# Patient Record
Sex: Female | Born: 1935 | Race: White | Hispanic: No | Marital: Married | Smoking: Never smoker
Health system: Southern US, Community
[De-identification: ages and names within clinical notes are randomized; demographics above are authoritative.]

## PROBLEM LIST (undated history)

## (undated) DIAGNOSIS — K219 Gastro-esophageal reflux disease without esophagitis: Secondary | ICD-10-CM

## (undated) DIAGNOSIS — Z8719 Personal history of other diseases of the digestive system: Secondary | ICD-10-CM

## (undated) DIAGNOSIS — E7800 Pure hypercholesterolemia, unspecified: Secondary | ICD-10-CM

## (undated) DIAGNOSIS — I10 Essential (primary) hypertension: Secondary | ICD-10-CM

## (undated) DIAGNOSIS — F419 Anxiety disorder, unspecified: Secondary | ICD-10-CM

## (undated) DIAGNOSIS — M419 Scoliosis, unspecified: Secondary | ICD-10-CM

## (undated) HISTORY — PX: TUBAL LIGATION: SHX77

## (undated) HISTORY — PX: JOINT REPLACEMENT: SHX530

## (undated) HISTORY — PX: BACK SURGERY: SHX140

## (undated) HISTORY — PX: CATARACT EXTRACTION: SUR2

## (undated) HISTORY — PX: APPENDECTOMY: SHX54

---

## 1973-02-23 HISTORY — PX: VAGINAL HYSTERECTOMY: SUR661

## 2004-08-28 ENCOUNTER — Encounter: Admission: RE | Admit: 2004-08-28 | Discharge: 2004-08-28 | Payer: Self-pay | Admitting: Orthopaedic Surgery

## 2004-10-13 ENCOUNTER — Encounter: Admission: RE | Admit: 2004-10-13 | Discharge: 2004-10-13 | Payer: Self-pay | Admitting: Orthopaedic Surgery

## 2010-02-23 HISTORY — PX: JOINT REPLACEMENT: SHX530

## 2012-02-24 HISTORY — PX: OTHER SURGICAL HISTORY: SHX169

## 2017-11-16 NOTE — H&P (Signed)
TOTAL HIP REVISION ADMISSION H&P  Patient is admitted for left total hip acetabular revision  Subjective:  Chief Complaint: left hip pain  HPI:The patient is an 83 y/o female who presents for pre-operative visit in preparation for their left total hip acetabular revision, which is scheduled on 12/08/17 with Dr. Lequita HaltAluisio at [facility]. The patient has had symptoms in the left hip including recurrent dislocation and posterior hip pain that will occasionally radiate down to her knee. This has impacted her quality of life adn ability to do activities of daily living. 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 She has a history of left total hip in 2009, and then a revision in 2016 after multiple dislocations. Both procedures were done by Dr. Wyline MoodWeller. She reports that the locking ring has broken two times since we last saw her. The patient denies an active infection.  There are no active problems to display for this patient.  History reviewed. No pertinent past medical history.  History reviewed. No pertinent surgical history.  No current facility-administered medications for this encounter.    No current outpatient medications on file.   Allergies not on file  Social History   Tobacco Use  . Smoking status: Not on file  Substance Use Topics  . Alcohol use: Not on file    History reviewed. No pertinent family history.    Review of Systems  Constitutional: Negative for chills and fever.  HENT: Negative for congestion, sore throat and tinnitus.   Eyes: Negative for double vision, photophobia and pain.  Respiratory: Negative for cough, shortness of breath and wheezing.   Cardiovascular: Negative for chest pain, palpitations and orthopnea.  Gastrointestinal: Negative for heartburn, nausea and vomiting.  Genitourinary: Negative for dysuria, frequency and urgency.  Musculoskeletal: Positive for joint pain.  Neurological: Negative for dizziness,  weakness and headaches.  Psychiatric/Behavioral: Negative for depression.    Objective:  Physical Exam  Well nourished and well developed. General: Alert and oriented x3, cooperative and pleasant, no acute distress. Head: normocephalic, atraumatic, neck supple. Eyes: EOMI. Respiratory: breath sounds clear in all fields, no wheezing, rales, or rhonchi. Cardiovascular: Regular rate and rhythm, no murmurs, gallops or rubs.  Abdomen: non-tender to palpation and soft, normoactive bowel sounds. Musculoskeletal: Antalgic gait and walking without assisted devices. Left hip exam: No swelling. Range of motion: Flexion 110 degrees, Internal rotation 20 degrees, External rotation 30 degrees, Abduction 30 degrees without pain. She is nontender over the greater trochanter. She has an antalgic gait pattern.  The patient has significant scoliosis in her thoracolumbar spine.  Calves soft and nontender. Motor function intact in LE. Strength 5/5 LE bilaterally. Neuro: Distal pulses 2+. Sensation to light touch intact in LE.  Vital signs in last 24 hours: Blood pressure: 152/78 mmHg  Labs:   There is no height or weight on file to calculate BMI.  Imaging Review:  There is no evidence of loosening of the acetabular cup or femoral stem.The bone quality appears to be adequate for age and reported activity level.    Preoperative templating of the joint replacement has been completed, documented, and submitted to the Operating Room personnel in order to optimize intra-operative equipment management.   Assessment/Plan:  End stage arthritis, left hip(s) with failed previous arthroplasty.  The patient history, physical examination, clinical judgement of the provider and imaging studies are consistent with end stage degenerative joint disease of the left hip(s), previous total hip arthroplasty. Revision total hip arthroplasty is deemed  medically necessary. The treatment options including medical  management, injection therapy, arthroscopy and arthroplasty were discussed at length. The risks and benefits of total hip arthroplasty were presented and reviewed. The risks due to aseptic loosening, infection, stiffness, dislocation/subluxation,  thromboembolic complications and other imponderables were discussed.  The patient acknowledged the explanation, agreed to proceed with the plan and consent was signed. Patient is being admitted for inpatient treatment for surgery, pain control, PT, OT, prophylactic antibiotics, VTE prophylaxis, progressive ambulation and ADL's and discharge planning. The patient is planning to be discharged home with HHPT versus SNF.   Therapy Plans: SNF versus HHPT then outpatient therapy Disposition: SNF versus home with husband Planned DVT Prophylaxis: Aspirin 325 mg BID DME needed: None PCP: Margaree Mackintosh (medical clearance provided) TXA: IV Allergies: Codeine (hives), oxycodone (hives) Planned Anesthesia: General Other: Pt reports she does not have a reaction with hydrocodone.  Hx of severe scoliosis.   - Patient was instructed on what medications to stop prior to surgery. - Follow-up visit in 2 weeks with Dr. Lequita Halt - Begin physical therapy following surgery - Pre-operative lab work as pre-surgical testing - Prescriptions will be provided in hospital at time of discharge  Arther Abbott, PA-C Orthopedic Surgery [facility] [place]

## 2017-11-18 ENCOUNTER — Encounter: Payer: Self-pay | Admitting: Student

## 2017-11-29 NOTE — Patient Instructions (Addendum)
Abigail Wolf  11/29/2017   Your procedure is scheduled on: 12-08-17   Report to [name] Main  Entrance    Report to admitting at 6;00AM    Call this number if you have problems the morning of surgery [phone]     Remember: Do not eat food or drink liquids :After Midnight. BRUSH YOUR TEETH MORNING OF SURGERY AND RINSE YOUR MOUTH OUT, NO CHEWING GUM CANDY OR MINTS.     Take these medicines the morning of surgery with A SIP OF WATER: clonazepam if needed                                You may not have any metal on your body including hair pins and              piercings  Do not wear jewelry, make-up, lotions, powders or perfumes, deodorant             Do not wear nail polish.  Do not shave  48 hours prior to surgery.                 Do not bring valuables to the hospital. [name] IS NOT             RESPONSIBLE   FOR VALUABLES.  Contacts, dentures or bridgework may not be worn into surgery.  Leave suitcase in the car. After surgery it may be brought to your room.                Please read over the following fact sheets you were given: _____________________________________________________________________             [name] - Preparing for Surgery Before surgery, you can play an important role.  Because skin is not sterile, your skin needs to be as free of germs as possible.  You can reduce the number of germs on your skin by washing with CHG (chlorahexidine gluconate) soap before surgery.  CHG is an antiseptic cleaner which kills germs and bonds with the skin to continue killing germs even after washing. Please DO NOT use if you have an allergy to CHG or antibacterial soaps.  If your skin becomes reddened/irritated stop using the CHG and inform your nurse when you arrive at Short Stay. Do not shave (including legs and underarms) for at least 48 hours prior to the first CHG shower.  You may shave your face/neck. Please follow these  instructions carefully:  1.  Shower with CHG Soap the night before surgery and the  morning of Surgery.  2.  If you choose to wash your hair, wash your hair first as usual with your  normal  shampoo.  3.  After you shampoo, rinse your hair and body thoroughly to remove the  shampoo.                           4.  Use CHG as you would any other liquid soap.  You can apply chg directly  to the skin and wash                       Gently with a scrungie or clean washcloth.  5.  Apply the CHG Soap to your body ONLY FROM THE NECK DOWN.  Do not use on face/ open                           Wound or open sores. Avoid contact with eyes, ears mouth and genitals (private parts).                       Wash face,  Genitals (private parts) with your normal soap.             6.  Wash thoroughly, paying special attention to the area where your surgery  will be performed.  7.  Thoroughly rinse your body with warm water from the neck down.  8.  DO NOT shower/wash with your normal soap after using and rinsing off  the CHG Soap.                9.  Pat yourself dry with a clean towel.            10.  Wear clean pajamas.            11.  Place clean sheets on your bed the night of your first shower and do not  sleep with pets. Day of Surgery : Do not apply any lotions/deodorants the morning of surgery.  Please wear clean clothes to the hospital/surgery center.  FAILURE TO FOLLOW THESE INSTRUCTIONS MAY RESULT IN THE CANCELLATION OF YOUR SURGERY PATIENT SIGNATURE_________________________________  NURSE SIGNATURE__________________________________  ________________________________________________________________________   Abigail Wolf  An incentive spirometer is a tool that can help keep your lungs clear and active. This tool measures how well you are filling your lungs with each breath. Taking long deep breaths may help reverse or decrease the chance of developing breathing (pulmonary) problems (especially  infection) following:  A long period of time when you are unable to move or be active. BEFORE THE PROCEDURE   If the spirometer includes an indicator to show your best effort, your nurse or respiratory therapist will set it to a desired goal.  If possible, sit up straight or lean slightly forward. Try not to slouch.  Hold the incentive spirometer in an upright position. INSTRUCTIONS FOR USE  1. Sit on the edge of your bed if possible, or sit up as far as you can in bed or on a chair. 2. Hold the incentive spirometer in an upright position. 3. Breathe out normally. 4. Place the mouthpiece in your mouth and seal your lips tightly around it. 5. Breathe in slowly and as deeply as possible, raising the piston or the ball toward the top of the column. 6. Hold your breath for 3-5 seconds or for as long as possible. Allow the piston or ball to fall to the bottom of the column. 7. Remove the mouthpiece from your mouth and breathe out normally. 8. Rest for a few seconds and repeat Steps 1 through 7 at least 10 times every 1-2 hours when you are awake. Take your time and take a few normal breaths between deep breaths. 9. The spirometer may include an indicator to show your best effort. Use the indicator as a goal to work toward during each repetition. 10. After each set of 10 deep breaths, practice coughing to be sure your lungs are clear. If you have an incision (the cut made at the time of surgery), support your incision when coughing by placing a pillow or rolled up towels firmly against it. Once you are able to get out of  bed, walk around indoors and cough well. You may stop using the incentive spirometer when instructed by your caregiver.  RISKS AND COMPLICATIONS  Take your time so you do not get dizzy or light-headed.  If you are in pain, you may need to take or ask for pain medication before doing incentive spirometry. It is harder to take a deep breath if you are having pain. AFTER  USE  Rest and breathe slowly and easily.  It can be helpful to keep track of a log of your progress. Your caregiver can provide you with a simple table to help with this. If you are using the spirometer at home, follow these instructions: [place] IF:   You are having difficultly using the spirometer.  You have trouble using the spirometer as often as instructed.  Your pain medication is not giving enough relief while using the spirometer.  You develop fever of 100.5 F (38.1 C) or higher. SEEK IMMEDIATE MEDICAL CARE IF:   You cough up bloody sputum that had not been present before.  You develop fever of 102 F (38.9 C) or greater.  You develop worsening pain at or near the incision site. MAKE SURE YOU:   Understand these instructions.  Will watch your condition.  Will get help right away if you are not doing well or get worse. Document Released: 06/22/2006 Document Revised: 05/04/2011 Document Reviewed: 08/23/2006 Exit[employer] Patient Information 2014 Exit[employer], [place].   ________________________________________________________________________  WHAT IS A BLOOD TRANSFUSION? Blood Transfusion Information  A transfusion is the replacement of blood or some of its parts. Blood is made up of multiple cells which provide different functions.  Red blood cells carry oxygen and are used for blood loss replacement.  White blood cells fight against infection.  Platelets control bleeding.  Plasma helps clot blood.  Other blood products are available for specialized needs, such as hemophilia or other clotting disorders. BEFORE THE TRANSFUSION  Who gives blood for transfusions?   Healthy volunteers who are fully evaluated to make sure their blood is safe. This is blood bank blood. Transfusion therapy is the safest it has ever been in the practice of medicine. Before blood is taken from a donor, a complete history is taken to make sure that person has no history of diseases  nor engages in risky social behavior (examples are intravenous drug use or sexual activity with multiple partners). The donor's travel history is screened to minimize risk of transmitting infections, such as malaria. The donated blood is tested for signs of infectious diseases, such as HIV and hepatitis. The blood is then tested to be sure it is compatible with you in order to minimize the chance of a transfusion reaction. If you or a relative donates blood, this is often done in anticipation of surgery and is not appropriate for emergency situations. It takes many days to process the donated blood. RISKS AND COMPLICATIONS Although transfusion therapy is very safe and saves many lives, the main dangers of transfusion include:   Getting an infectious disease.  Developing a transfusion reaction. This is an allergic reaction to something in the blood you were given. Every precaution is taken to prevent this. The decision to have a blood transfusion has been considered carefully by your caregiver before blood is given. Blood is not given unless the benefits outweigh the risks. AFTER THE TRANSFUSION  Right after receiving a blood transfusion, you will usually feel much better and more energetic. This is especially true if your red blood  cells have gotten low (anemic). The transfusion raises the level of the red blood cells which carry oxygen, and this usually causes an energy increase.  The nurse administering the transfusion will monitor you carefully for complications. HOME CARE INSTRUCTIONS  No special instructions are needed after a transfusion. You may find your energy is better. Speak with your caregiver about any limitations on activity for underlying diseases you may have. SEEK MEDICAL CARE IF:   Your condition is not improving after your transfusion.  You develop redness or irritation at the intravenous (IV) site. SEEK IMMEDIATE MEDICAL CARE IF:  Any of the following symptoms occur over the  next 12 hours:  Shaking chills.  You have a temperature by mouth above 102 F (38.9 C), not controlled by medicine.  Chest, back, or muscle pain.  People around you feel you are not acting correctly or are confused.  Shortness of breath or difficulty breathing.  Dizziness and fainting.  You get a rash or develop hives.  You have a decrease in urine output.  Your urine turns a dark color or changes to pink, red, or Jacober. Any of the following symptoms occur over the next 10 days:  You have a temperature by mouth above 102 F (38.9 C), not controlled by medicine.  Shortness of breath.  Weakness after normal activity.  The white part of the eye turns yellow (jaundice).  You have a decrease in the amount of urine or are urinating less often.  Your urine turns a dark color or changes to pink, red, or Stober. Document Released: 02/07/2000 Document Revised: 05/04/2011 Document Reviewed: 09/26/2007 [facility] Patient Information 2014 [place], [place].  _______________________________________________________________________

## 2017-12-02 ENCOUNTER — Encounter (HOSPITAL_COMMUNITY): Payer: Self-pay | Admitting: Emergency Medicine

## 2017-12-02 ENCOUNTER — Encounter (HOSPITAL_COMMUNITY)
Admission: RE | Admit: 2017-12-02 | Discharge: 2017-12-02 | Disposition: A | Discharge status: Home / Self Care | Payer: Medicare Other | Source: Ambulatory Visit | Attending: Orthopedic Surgery | Admitting: Orthopedic Surgery

## 2017-12-02 ENCOUNTER — Other Ambulatory Visit: Payer: Self-pay

## 2017-12-02 DIAGNOSIS — I10 Essential (primary) hypertension: Secondary | ICD-10-CM | POA: Insufficient documentation

## 2017-12-02 DIAGNOSIS — R001 Bradycardia, unspecified: Secondary | ICD-10-CM | POA: Diagnosis not present

## 2017-12-02 DIAGNOSIS — E119 Type 2 diabetes mellitus without complications: Secondary | ICD-10-CM | POA: Diagnosis not present

## 2017-12-02 DIAGNOSIS — Z01818 Encounter for other preprocedural examination: Secondary | ICD-10-CM | POA: Insufficient documentation

## 2017-12-02 HISTORY — DX: Gastro-esophageal reflux disease without esophagitis: K21.9

## 2017-12-02 HISTORY — DX: Scoliosis, unspecified: M41.9

## 2017-12-02 HISTORY — DX: Personal history of other diseases of the digestive system: Z87.19

## 2017-12-02 HISTORY — DX: Pure hypercholesterolemia, unspecified: E78.00

## 2017-12-02 HISTORY — DX: Anxiety disorder, unspecified: F41.9

## 2017-12-02 HISTORY — DX: Essential (primary) hypertension: I10

## 2017-12-02 LAB — CBC
HEMATOCRIT: 37.2 % (ref 36.0–46.0)
Hemoglobin: 11.9 g/dL — ABNORMAL LOW (ref 12.0–15.0)
MCH: 31 pg (ref 26.0–34.0)
MCHC: 32 g/dL (ref 30.0–36.0)
MCV: 96.9 fL (ref 80.0–100.0)
NRBC: 0 % (ref 0.0–0.2)
Platelets: 259 10*3/uL (ref 150–400)
RBC: 3.84 MIL/uL — ABNORMAL LOW (ref 3.87–5.11)
RDW: 13.6 % (ref 11.5–15.5)
WBC: 9.8 10*3/uL (ref 4.0–10.5)

## 2017-12-02 LAB — SURGICAL PCR SCREEN
MRSA, PCR: NEGATIVE
Staphylococcus aureus: NEGATIVE

## 2017-12-02 LAB — COMPREHENSIVE METABOLIC PANEL
ALBUMIN: 4.5 g/dL (ref 3.5–5.0)
ALK PHOS: 40 U/L (ref 38–126)
ALT: 19 U/L (ref 0–44)
AST: 22 U/L (ref 15–41)
Anion gap: 11 (ref 5–15)
BILIRUBIN TOTAL: 0.7 mg/dL (ref 0.3–1.2)
BUN: 17 mg/dL (ref 8–23)
CALCIUM: 10.4 mg/dL — AB (ref 8.9–10.3)
CO2: 27 mmol/L (ref 22–32)
CREATININE: 0.81 mg/dL (ref 0.44–1.00)
Chloride: 97 mmol/L — ABNORMAL LOW (ref 98–111)
GFR calc Af Amer: >60 mL/min (ref >60)
GLUCOSE: 112 mg/dL — AB (ref 70–99)
Potassium: 5.1 mmol/L (ref 3.5–5.1)
Sodium: 135 mmol/L (ref 135–145)
TOTAL PROTEIN: 7.7 g/dL (ref 6.5–8.1)

## 2017-12-02 LAB — PROTIME-INR
INR: 0.9
PROTHROMBIN TIME: 12 s (ref 11.4–15.2)

## 2017-12-02 LAB — APTT: aPTT: 38 seconds — ABNORMAL HIGH (ref 24–36)

## 2017-12-02 LAB — ABO/RH: ABO/RH(D): A POS

## 2017-12-07 NOTE — Anesthesia Preprocedure Evaluation (Addendum)
Anesthesia Evaluation  Patient identified by MRN, date of birth, ID band Patient awake    Reviewed: Allergy & Precautions, NPO status , Patient's Chart, lab work & pertinent test results  History of Anesthesia Complications Negative for: history of anesthetic complications  Airway Mallampati: III  TM Distance: >3 FB Neck ROM: Full    Dental  (+) Dental Advisory Given   Pulmonary neg pulmonary ROS,    breath sounds clear to auscultation       Cardiovascular hypertension, Pt. on medications  Rhythm:Regular Rate:Normal     Neuro/Psych PSYCHIATRIC DISORDERS Anxiety negative neurological ROS     GI/Hepatic Neg liver ROS, hiatal hernia, GERD  Controlled,  Endo/Other  negative endocrine ROS  Renal/GU negative Renal ROS     Musculoskeletal  Scoliosis    Abdominal   Peds  Hematology negative hematology ROS (+)   Anesthesia Other Findings   Reproductive/Obstetrics                            Anesthesia Physical Anesthesia Plan  ASA: II  Anesthesia Plan: General   Post-op Pain Management:    Induction: Intravenous  PONV Risk Score and Plan: 3 and Treatment may vary due to age or medical condition, Propofol infusion and Ondansetron  Airway Management Planned: Oral ETT  Additional Equipment: None  Intra-op Plan:   Post-operative Plan: Extubation in OR  Informed Consent: I have reviewed the patients History and Physical, chart, labs and discussed the procedure including the risks, benefits and alternatives for the proposed anesthesia with the patient or authorized representative who has indicated his/her understanding and acceptance.   Dental advisory given  Plan Discussed with: CRNA and Anesthesiologist  Anesthesia Plan Comments: (Patient refusing spinal given stated difficulty in past with spinal due to scoliosis. Plan for GETA.)       Anesthesia Quick Evaluation

## 2017-12-08 ENCOUNTER — Encounter (HOSPITAL_COMMUNITY)
Admission: RE | Disposition: A | Discharge status: Home Health | Payer: Self-pay | Source: Ambulatory Visit | Attending: Orthopedic Surgery

## 2017-12-08 ENCOUNTER — Inpatient Hospital Stay (HOSPITAL_COMMUNITY)
Admission: RE | Admit: 2017-12-08 | Discharge: 2017-12-11 | DRG: 468 | Disposition: A | Discharge status: Home Health | Payer: Medicare Other | Source: Ambulatory Visit | Attending: Orthopedic Surgery | Admitting: Orthopedic Surgery

## 2017-12-08 ENCOUNTER — Other Ambulatory Visit: Payer: Self-pay

## 2017-12-08 ENCOUNTER — Inpatient Hospital Stay (HOSPITAL_COMMUNITY): Payer: Medicare Other | Admitting: Anesthesiology

## 2017-12-08 ENCOUNTER — Encounter (HOSPITAL_COMMUNITY): Payer: Self-pay | Admitting: Emergency Medicine

## 2017-12-08 ENCOUNTER — Inpatient Hospital Stay (HOSPITAL_COMMUNITY): Payer: Medicare Other

## 2017-12-08 DIAGNOSIS — F419 Anxiety disorder, unspecified: Secondary | ICD-10-CM | POA: Diagnosis present

## 2017-12-08 DIAGNOSIS — Z885 Allergy status to narcotic agent status: Secondary | ICD-10-CM | POA: Diagnosis not present

## 2017-12-08 DIAGNOSIS — I10 Essential (primary) hypertension: Secondary | ICD-10-CM | POA: Diagnosis present

## 2017-12-08 DIAGNOSIS — M419 Scoliosis, unspecified: Secondary | ICD-10-CM | POA: Diagnosis present

## 2017-12-08 DIAGNOSIS — T84091A Other mechanical complication of internal left hip prosthesis, initial encounter: Secondary | ICD-10-CM | POA: Diagnosis present

## 2017-12-08 DIAGNOSIS — T84018A Broken internal joint prosthesis, other site, initial encounter: Secondary | ICD-10-CM

## 2017-12-08 DIAGNOSIS — Z7982 Long term (current) use of aspirin: Secondary | ICD-10-CM | POA: Diagnosis not present

## 2017-12-08 DIAGNOSIS — E7800 Pure hypercholesterolemia, unspecified: Secondary | ICD-10-CM | POA: Diagnosis present

## 2017-12-08 DIAGNOSIS — M25562 Pain in left knee: Secondary | ICD-10-CM | POA: Diagnosis present

## 2017-12-08 DIAGNOSIS — Y792 Prosthetic and other implants, materials and accessory orthopedic devices associated with adverse incidents: Secondary | ICD-10-CM | POA: Diagnosis present

## 2017-12-08 DIAGNOSIS — Z96649 Presence of unspecified artificial hip joint: Secondary | ICD-10-CM

## 2017-12-08 DIAGNOSIS — Z886 Allergy status to analgesic agent status: Secondary | ICD-10-CM | POA: Diagnosis not present

## 2017-12-08 DIAGNOSIS — K219 Gastro-esophageal reflux disease without esophagitis: Secondary | ICD-10-CM | POA: Diagnosis present

## 2017-12-08 DIAGNOSIS — K449 Diaphragmatic hernia without obstruction or gangrene: Secondary | ICD-10-CM | POA: Diagnosis present

## 2017-12-08 HISTORY — PX: ACETABULAR REVISION: SHX5712

## 2017-12-08 SURGERY — REVISION, TOTAL ARTHROPLASTY, HIP, ACETABULAR COMPONENT
Anesthesia: Spinal | Site: Hip | Laterality: Left

## 2017-12-08 MED ORDER — CEFAZOLIN SODIUM-DEXTROSE 2-4 GM/100ML-% IV SOLN
2.0000 g | INTRAVENOUS | Status: AC
  Administered 2017-12-08: 2 g via INTRAVENOUS
  Filled 2017-12-08: qty 100

## 2017-12-08 MED ORDER — DEXAMETHASONE SODIUM PHOSPHATE 10 MG/ML IJ SOLN: 8.0000 mg | Freq: Once | INTRAMUSCULAR | Status: DC

## 2017-12-08 MED ORDER — FENTANYL CITRATE (PF) 100 MCG/2ML IJ SOLN
INTRAMUSCULAR | Status: AC
  Filled 2017-12-08: qty 2

## 2017-12-08 MED ORDER — 0.9 % SODIUM CHLORIDE (POUR BTL) OPTIME
TOPICAL | Status: DC | PRN
  Administered 2017-12-08: 1000 mL

## 2017-12-08 MED ORDER — ATENOLOL 50 MG PO TABS
50.0000 mg | ORAL_TABLET | Freq: Every day | ORAL | Status: DC
  Administered 2017-12-08 – 2017-12-10 (×3): 50 mg via ORAL
  Filled 2017-12-08 (×3): qty 1

## 2017-12-08 MED ORDER — ONDANSETRON HCL 4 MG/2ML IJ SOLN: 4.0000 mg | Freq: Four times a day (QID) | INTRAMUSCULAR | Status: DC | PRN

## 2017-12-08 MED ORDER — HYDROMORPHONE HCL 1 MG/ML IJ SOLN: 0.2500 mg | INTRAMUSCULAR | Status: DC | PRN

## 2017-12-08 MED ORDER — ROCURONIUM BROMIDE 50 MG/5ML IV SOSY
PREFILLED_SYRINGE | INTRAVENOUS | Status: DC | PRN
  Administered 2017-12-08: 50 mg via INTRAVENOUS

## 2017-12-08 MED ORDER — METOCLOPRAMIDE HCL 5 MG/ML IJ SOLN: 5.0000 mg | Freq: Three times a day (TID) | INTRAMUSCULAR | Status: DC | PRN

## 2017-12-08 MED ORDER — METOCLOPRAMIDE HCL 5 MG PO TABS: 5.0000 mg | ORAL_TABLET | Freq: Three times a day (TID) | ORAL | Status: DC | PRN

## 2017-12-08 MED ORDER — DOCUSATE SODIUM 100 MG PO CAPS
100.0000 mg | ORAL_CAPSULE | Freq: Two times a day (BID) | ORAL | Status: DC
  Administered 2017-12-08 – 2017-12-11 (×6): 100 mg via ORAL
  Filled 2017-12-08 (×6): qty 1

## 2017-12-08 MED ORDER — SUCCINYLCHOLINE CHLORIDE 200 MG/10ML IV SOSY
PREFILLED_SYRINGE | INTRAVENOUS | Status: AC
  Filled 2017-12-08: qty 10

## 2017-12-08 MED ORDER — ATORVASTATIN CALCIUM 40 MG PO TABS
40.0000 mg | ORAL_TABLET | Freq: Every day | ORAL | Status: DC
  Administered 2017-12-08 – 2017-12-10 (×3): 40 mg via ORAL
  Filled 2017-12-08 (×3): qty 1

## 2017-12-08 MED ORDER — EPHEDRINE 5 MG/ML INJ
INTRAVENOUS | Status: AC
  Filled 2017-12-08: qty 10

## 2017-12-08 MED ORDER — SODIUM CHLORIDE 0.9 % IV SOLN
INTRAVENOUS | Status: DC
  Administered 2017-12-08 – 2017-12-09 (×2): via INTRAVENOUS

## 2017-12-08 MED ORDER — HYDROCODONE-ACETAMINOPHEN 5-325 MG PO TABS
1.0000 | ORAL_TABLET | ORAL | Status: DC | PRN
  Administered 2017-12-08 – 2017-12-11 (×15): 2 via ORAL
  Filled 2017-12-08 (×15): qty 2

## 2017-12-08 MED ORDER — FENTANYL CITRATE (PF) 100 MCG/2ML IJ SOLN
50.0000 ug | INTRAMUSCULAR | Status: AC | PRN
  Administered 2017-12-08 (×4): 50 ug via INTRAVENOUS

## 2017-12-08 MED ORDER — HYDROMORPHONE HCL 1 MG/ML IJ SOLN
INTRAMUSCULAR | Status: AC
  Filled 2017-12-08: qty 2

## 2017-12-08 MED ORDER — ONDANSETRON HCL 4 MG PO TABS: 4.0000 mg | ORAL_TABLET | Freq: Four times a day (QID) | ORAL | Status: DC | PRN

## 2017-12-08 MED ORDER — EPHEDRINE SULFATE-NACL 50-0.9 MG/10ML-% IV SOSY
PREFILLED_SYRINGE | INTRAVENOUS | Status: DC | PRN
  Administered 2017-12-08: 5 mg via INTRAVENOUS
  Administered 2017-12-08: 10 mg via INTRAVENOUS

## 2017-12-08 MED ORDER — DEXAMETHASONE SODIUM PHOSPHATE 10 MG/ML IJ SOLN
10.0000 mg | Freq: Once | INTRAMUSCULAR | Status: AC
  Administered 2017-12-09: 10 mg via INTRAVENOUS
  Filled 2017-12-08: qty 1

## 2017-12-08 MED ORDER — ONDANSETRON HCL 4 MG/2ML IJ SOLN: 4.0000 mg | Freq: Once | INTRAMUSCULAR | Status: DC | PRN

## 2017-12-08 MED ORDER — FLEET ENEMA 7-19 GM/118ML RE ENEM: 1.0000 | ENEMA | Freq: Once | RECTAL | Status: DC | PRN

## 2017-12-08 MED ORDER — STERILE WATER FOR IRRIGATION IR SOLN
Status: DC | PRN
  Administered 2017-12-08: 2000 mL

## 2017-12-08 MED ORDER — LACTATED RINGERS IV SOLN
INTRAVENOUS | Status: DC
  Administered 2017-12-08 (×2): via INTRAVENOUS

## 2017-12-08 MED ORDER — GLYCOPYRROLATE PF 0.2 MG/ML IJ SOSY
PREFILLED_SYRINGE | INTRAMUSCULAR | Status: AC
  Filled 2017-12-08: qty 1

## 2017-12-08 MED ORDER — ONDANSETRON HCL 4 MG/2ML IJ SOLN
INTRAMUSCULAR | Status: AC
  Filled 2017-12-08: qty 2

## 2017-12-08 MED ORDER — KETAMINE HCL 10 MG/ML IJ SOLN
INTRAMUSCULAR | Status: DC | PRN
  Administered 2017-12-08: 25 mg via INTRAVENOUS

## 2017-12-08 MED ORDER — FENTANYL CITRATE (PF) 100 MCG/2ML IJ SOLN
INTRAMUSCULAR | Status: DC | PRN
  Administered 2017-12-08: 50 ug via INTRAVENOUS
  Administered 2017-12-08: 100 ug via INTRAVENOUS
  Administered 2017-12-08 (×3): 50 ug via INTRAVENOUS

## 2017-12-08 MED ORDER — PHENOL 1.4 % MT LIQD: 1.0000 | OROMUCOSAL | Status: DC | PRN

## 2017-12-08 MED ORDER — SUGAMMADEX SODIUM 200 MG/2ML IV SOLN
INTRAVENOUS | Status: DC | PRN
  Administered 2017-12-08: 150 mg via INTRAVENOUS

## 2017-12-08 MED ORDER — DEXAMETHASONE SODIUM PHOSPHATE 10 MG/ML IJ SOLN
INTRAMUSCULAR | Status: DC | PRN
  Administered 2017-12-08: 8 mg via INTRAVENOUS

## 2017-12-08 MED ORDER — BISACODYL 10 MG RE SUPP: 10.0000 mg | Freq: Every day | RECTAL | Status: DC | PRN

## 2017-12-08 MED ORDER — METHOCARBAMOL 500 MG PO TABS
500.0000 mg | ORAL_TABLET | Freq: Four times a day (QID) | ORAL | Status: DC | PRN
  Administered 2017-12-08 – 2017-12-10 (×6): 500 mg via ORAL
  Filled 2017-12-08 (×6): qty 1

## 2017-12-08 MED ORDER — ACETAMINOPHEN 500 MG PO TABS
500.0000 mg | ORAL_TABLET | Freq: Four times a day (QID) | ORAL | Status: AC
  Administered 2017-12-08 (×2): 500 mg via ORAL
  Filled 2017-12-08 (×3): qty 1

## 2017-12-08 MED ORDER — FENTANYL CITRATE (PF) 100 MCG/2ML IJ SOLN
25.0000 ug | INTRAMUSCULAR | Status: DC | PRN
  Administered 2017-12-08 (×4): 50 ug via INTRAVENOUS

## 2017-12-08 MED ORDER — GLYCOPYRROLATE PF 0.2 MG/ML IJ SOSY
PREFILLED_SYRINGE | INTRAMUSCULAR | Status: DC | PRN
  Administered 2017-12-08: .2 mg via INTRAVENOUS

## 2017-12-08 MED ORDER — HYDROMORPHONE HCL 1 MG/ML IJ SOLN
0.2500 mg | INTRAMUSCULAR | Status: DC | PRN
  Administered 2017-12-08 (×2): 0.25 mg via INTRAVENOUS
  Administered 2017-12-08: 0.5 mg via INTRAVENOUS

## 2017-12-08 MED ORDER — POLYETHYLENE GLYCOL 3350 17 G PO PACK
17.0000 g | PACK | Freq: Every day | ORAL | Status: DC | PRN
  Administered 2017-12-11: 17 g via ORAL
  Filled 2017-12-08: qty 1

## 2017-12-08 MED ORDER — LIDOCAINE 2% (20 MG/ML) 5 ML SYRINGE
INTRAMUSCULAR | Status: AC
  Filled 2017-12-08: qty 5

## 2017-12-08 MED ORDER — METHOCARBAMOL 500 MG IVPB - SIMPLE MED
500.0000 mg | Freq: Four times a day (QID) | INTRAVENOUS | Status: DC | PRN
  Filled 2017-12-08: qty 50

## 2017-12-08 MED ORDER — TRANEXAMIC ACID-NACL 1000-0.7 MG/100ML-% IV SOLN
1000.0000 mg | Freq: Once | INTRAVENOUS | Status: AC
  Administered 2017-12-08: 1000 mg via INTRAVENOUS
  Filled 2017-12-08: qty 100

## 2017-12-08 MED ORDER — TRANEXAMIC ACID-NACL 1000-0.7 MG/100ML-% IV SOLN
1000.0000 mg | INTRAVENOUS | Status: AC
  Administered 2017-12-08: 1000 mg via INTRAVENOUS
  Filled 2017-12-08: qty 100

## 2017-12-08 MED ORDER — ONDANSETRON HCL 4 MG/2ML IJ SOLN
INTRAMUSCULAR | Status: DC | PRN
  Administered 2017-12-08: 4 mg via INTRAVENOUS

## 2017-12-08 MED ORDER — CHLORHEXIDINE GLUCONATE 4 % EX LIQD: 60.0000 mL | Freq: Once | CUTANEOUS | Status: DC

## 2017-12-08 MED ORDER — LIDOCAINE 2% (20 MG/ML) 5 ML SYRINGE
INTRAMUSCULAR | Status: DC | PRN
  Administered 2017-12-08: 60 mg via INTRAVENOUS

## 2017-12-08 MED ORDER — ACETAMINOPHEN 10 MG/ML IV SOLN
1000.0000 mg | Freq: Once | INTRAVENOUS | Status: AC
  Administered 2017-12-08: 1000 mg via INTRAVENOUS
  Filled 2017-12-08: qty 100

## 2017-12-08 MED ORDER — DEXAMETHASONE SODIUM PHOSPHATE 10 MG/ML IJ SOLN
INTRAMUSCULAR | Status: AC
  Filled 2017-12-08: qty 1

## 2017-12-08 MED ORDER — METHOCARBAMOL 500 MG IVPB - SIMPLE MED
INTRAVENOUS | Status: AC
  Administered 2017-12-08: 500 mg
  Filled 2017-12-08: qty 50

## 2017-12-08 MED ORDER — PROPOFOL 10 MG/ML IV BOLUS
INTRAVENOUS | Status: DC | PRN
  Administered 2017-12-08: 100 mg via INTRAVENOUS

## 2017-12-08 MED ORDER — CEFAZOLIN SODIUM-DEXTROSE 1-4 GM/50ML-% IV SOLN
1.0000 g | Freq: Four times a day (QID) | INTRAVENOUS | Status: AC
  Administered 2017-12-08 (×2): 1 g via INTRAVENOUS
  Filled 2017-12-08 (×2): qty 50

## 2017-12-08 MED ORDER — DIPHENHYDRAMINE HCL 12.5 MG/5ML PO ELIX: 12.5000 mg | ORAL_SOLUTION | ORAL | Status: DC | PRN

## 2017-12-08 MED ORDER — PROPOFOL 10 MG/ML IV BOLUS
INTRAVENOUS | Status: AC
  Filled 2017-12-08: qty 40

## 2017-12-08 MED ORDER — ROCURONIUM BROMIDE 10 MG/ML (PF) SYRINGE
PREFILLED_SYRINGE | INTRAVENOUS | Status: AC
  Filled 2017-12-08: qty 10

## 2017-12-08 MED ORDER — ASPIRIN EC 325 MG PO TBEC
325.0000 mg | DELAYED_RELEASE_TABLET | Freq: Two times a day (BID) | ORAL | Status: DC
  Administered 2017-12-09 – 2017-12-11 (×5): 325 mg via ORAL
  Filled 2017-12-08 (×5): qty 1

## 2017-12-08 MED ORDER — CLONAZEPAM 0.5 MG PO TABS: 0.5000 mg | ORAL_TABLET | Freq: Two times a day (BID) | ORAL | Status: DC | PRN

## 2017-12-08 MED ORDER — MENTHOL 3 MG MT LOZG: 1.0000 | LOZENGE | OROMUCOSAL | Status: DC | PRN

## 2017-12-08 MED ORDER — SUGAMMADEX SODIUM 200 MG/2ML IV SOLN
INTRAVENOUS | Status: AC
  Filled 2017-12-08: qty 2

## 2017-12-08 MED ORDER — HYDROMORPHONE HCL 1 MG/ML IJ SOLN: 0.5000 mg | INTRAMUSCULAR | Status: DC | PRN

## 2017-12-08 MED ORDER — KETAMINE HCL 10 MG/ML IJ SOLN
INTRAMUSCULAR | Status: AC
  Filled 2017-12-08: qty 1

## 2017-12-08 MED ORDER — HYDROMORPHONE HCL 1 MG/ML IJ SOLN
INTRAMUSCULAR | Status: AC
  Filled 2017-12-08: qty 1

## 2017-12-08 SURGICAL SUPPLY — 65 items
ARTICULEZE HEAD 36 12 (Hips) ×2 IMPLANT
ARTICULEZE HEAD 36MM 12 (Hips) ×1 IMPLANT
BAG ZIPLOCK 12X15 (MISCELLANEOUS) ×6 IMPLANT
BIT DRILL 2.8X128 (BIT) ×2 IMPLANT
BIT DRILL 2.8X128MM (BIT) ×1
BLADE EXTENDED COATED 6.5IN (ELECTRODE) ×3 IMPLANT
BLADE SAW SAG 73X25 THK (BLADE)
BLADE SAW SGTL 73X25 THK (BLADE) IMPLANT
COVER SURGICAL LIGHT HANDLE (MISCELLANEOUS) ×3 IMPLANT
COVER WAND RF STERILE (DRAPES) IMPLANT
CUP ACETAB 56MM (Orthopedic Implant) ×3 IMPLANT
DRAPE INCISE IOBAN 66X45 STRL (DRAPES) ×3 IMPLANT
DRAPE ORTHO SPLIT 77X108 STRL (DRAPES) ×4
DRAPE POUCH INSTRU U-SHP 10X18 (DRAPES) ×3 IMPLANT
DRAPE SHEET LG 3/4 BI-LAMINATE (DRAPES) ×3 IMPLANT
DRAPE SURG ORHT 6 SPLT 77X108 (DRAPES) ×2 IMPLANT
DRAPE U-SHAPE 47X51 STRL (DRAPES) ×3 IMPLANT
DRSG ADAPTIC 3X8 NADH LF (GAUZE/BANDAGES/DRESSINGS) ×3 IMPLANT
DRSG EMULSION OIL 3X16 NADH (GAUZE/BANDAGES/DRESSINGS) ×3 IMPLANT
DRSG MEPILEX BORDER 4X4 (GAUZE/BANDAGES/DRESSINGS) ×6 IMPLANT
DRSG MEPILEX BORDER 4X8 (GAUZE/BANDAGES/DRESSINGS) ×3 IMPLANT
DURAPREP 26ML APPLICATOR (WOUND CARE) ×3 IMPLANT
ELECT REM PT RETURN 15FT ADLT (MISCELLANEOUS) ×3 IMPLANT
EVACUATOR 1/8 PVC DRAIN (DRAIN) ×3 IMPLANT
FACESHIELD WRAPAROUND (MASK) ×12 IMPLANT
GAUZE SPONGE 4X4 12PLY STRL (GAUZE/BANDAGES/DRESSINGS) ×3 IMPLANT
GLOVE BIO SURGEON STRL SZ7 (GLOVE) ×3 IMPLANT
GLOVE BIO SURGEON STRL SZ8 (GLOVE) ×3 IMPLANT
GLOVE BIOGEL PI IND STRL 7.0 (GLOVE) ×1 IMPLANT
GLOVE BIOGEL PI IND STRL 8 (GLOVE) ×3 IMPLANT
GLOVE BIOGEL PI INDICATOR 7.0 (GLOVE) ×2
GLOVE BIOGEL PI INDICATOR 8 (GLOVE) ×6
GOWN STRL REUS W/TWL LRG LVL3 (GOWN DISPOSABLE) ×6 IMPLANT
HEAD ARTICULEZE 36 12 (Hips) ×1 IMPLANT
IMMOBILIZER KNEE 20 (SOFTGOODS)
IMMOBILIZER KNEE 20 THIGH 36 (SOFTGOODS) IMPLANT
KIT BASIN OR (CUSTOM PROCEDURE TRAY) ×3 IMPLANT
LINER NEUTRAL 52MMX36MMX56 (Hips) ×3 IMPLANT
MANIFOLD NEPTUNE II (INSTRUMENTS) ×3 IMPLANT
NDL SAFETY ECLIPSE 18X1.5 (NEEDLE) IMPLANT
NEEDLE HYPO 18GX1.5 SHARP (NEEDLE)
NS IRRIG 1000ML POUR BTL (IV SOLUTION) ×3 IMPLANT
PACK TOTAL JOINT (CUSTOM PROCEDURE TRAY) ×3 IMPLANT
PASSER SUT SWANSON 36MM LOOP (INSTRUMENTS) ×3 IMPLANT
POSITIONER SURGICAL ARM (MISCELLANEOUS) ×3 IMPLANT
SCREW 6.5MMX35MM (Screw) ×6 IMPLANT
SCREW PINN CAN 6.5X20 (Screw) ×3 IMPLANT
SCREW PINN CAN BONE 6.5MMX15MM (Screw) ×3 IMPLANT
STAPLER VISISTAT 35W (STAPLE) IMPLANT
SUCTION FRAZIER HANDLE 12FR (TUBING) ×2
SUCTION TUBE FRAZIER 12FR DISP (TUBING) ×1 IMPLANT
SUT ETHIBOND NAB CT1 #1 30IN (SUTURE) ×6 IMPLANT
SUT MNCRL AB 4-0 PS2 18 (SUTURE) ×3 IMPLANT
SUT STRATAFIX 0 PDS 27 VIOLET (SUTURE) ×3
SUT VIC AB 2-0 CT1 27 (SUTURE) ×6
SUT VIC AB 2-0 CT1 TAPERPNT 27 (SUTURE) ×3 IMPLANT
SUTURE STRATFX 0 PDS 27 VIOLET (SUTURE) ×1 IMPLANT
SWAB COLLECTION DEVICE MRSA (MISCELLANEOUS) ×3 IMPLANT
SWAB CULTURE ESWAB REG 1ML (MISCELLANEOUS) IMPLANT
SYR 50ML LL SCALE MARK (SYRINGE) ×3 IMPLANT
TOWEL OR 17X26 10 PK STRL BLUE (TOWEL DISPOSABLE) ×6 IMPLANT
TOWEL OR NON WOVEN STRL DISP B (DISPOSABLE) ×3 IMPLANT
TRAY FOLEY CATH 14FR (SET/KITS/TRAYS/PACK) ×3 IMPLANT
WATER STERILE IRR 1000ML POUR (IV SOLUTION) ×3 IMPLANT
YANKAUER SUCT BULB TIP 10FT TU (MISCELLANEOUS) ×3 IMPLANT

## 2017-12-08 NOTE — Interval H&P Note (Signed)
History and Physical Interval Note:  12/08/2017 8:07 AM  Starling Manns  has presented today for surgery, with the diagnosis of failed left total hip arthroplasty  The various methods of treatment have been discussed with the patient and family. After consideration of risks, benefits and other options for treatment, the patient has consented to  Procedure(s): Left Acetabular revision (Left) as a surgical intervention .  The patient's history has been reviewed, patient examined, no change in status, stable for surgery.  I have reviewed the patient's chart and labs.  Questions were answered to the patient's satisfaction.     Homero Fellers Suhaylah Wampole

## 2017-12-08 NOTE — Brief Op Note (Signed)
12/08/2017  10:05 AM  PATIENT:  Starling Manns  83 y.o. female  PRE-OPERATIVE DIAGNOSIS:  failed left total hip arthroplasty  POST-OPERATIVE DIAGNOSIS:  failed left total hip arthroplasty  PROCEDURE:  Procedure(s): Left Acetabular revision (Left)  SURGEON:  Surgeon(s) and Role:    Ollen Gross, MD - Primary  PHYSICIAN ASSISTANT:   ASSISTANTS: Arther Abbott, PA-C   ANESTHESIA:   general  EBL:  300 mL   BLOOD ADMINISTERED:none  DRAINS: (Medium) Hemovact drain(s) in the left hip with  Suction Open   LOCAL MEDICATIONS USED:  NONE  COUNTS:  YES  TOURNIQUET:  * No tourniquets in log *  DICTATION: .Other Dictation: Dictation Number [id number]  PLAN OF CARE: Admit to inpatient   PATIENT DISPOSITION:  PACU - hemodynamically stable.

## 2017-12-08 NOTE — Transfer of Care (Signed)
Immediate Anesthesia Transfer of Care Note  Patient: Abigail Wolf  Procedure(s) Performed: Left Acetabular revision (Left Hip)  Patient Location: PACU  Anesthesia Type:General  Level of Consciousness: awake and oriented  Airway & Oxygen Therapy: Patient Spontanous Breathing and Patient connected to face mask oxygen  Post-op Assessment: Report given to RN and Post -op Vital signs reviewed and stable  Post vital signs: Reviewed and stable  Last Vitals:  Vitals Value Taken Time  BP 171/78 12/08/2017 10:39 AM  Temp    Pulse 66 12/08/2017 10:41 AM  Resp 17 12/08/2017 10:41 AM  SpO2 100 % 12/08/2017 10:41 AM  Vitals shown include unvalidated device data.  Last Pain:  Vitals:   12/08/17 0652  TempSrc:   PainSc: 0-No pain      Patients Stated Pain Goal: 4 (12/08/17 6962)  Complications: No apparent anesthesia complications

## 2017-12-08 NOTE — Evaluation (Signed)
Physical Therapy Evaluation Patient Details Name: Abigail Wolf MRN: [id number] DOB: 01-02-1935 Today's Date: 12/08/2017   History of Present Illness  83 YO female s/p L THA acetabular revision, posterior approach on 12/08/17. PMH includes L THA with recurrent dislocations, scoliosis, HTN, hiatial hernia, GERD, anxiety, back surgery, R TKR 2012, cataracts.   Clinical Impression   Pt presents with L hip pain, decreased knowledge of precautions, difficulty performing mobility, and decreased tolerance for ambulation. Pt with dizziness after 5 ft ambulation with BP of 99/53 and HR of 56 bpm. PT discontinued further ambulation due to pt's symptoms, will progress mobility as tolerated. D/c plan home with HHPT versus SNF. PT current recommendation is SNF based on eval and lack of physical assist at home if needed. PT to continue to reassess pt's needs during hospital stay.      Follow Up Recommendations Follow surgeon's recommendation for DC plan and follow-up therapies;Supervision for mobility/OOB(SNF vs HHPT)    Equipment Recommendations  None recommended by PT    Recommendations for Other Services       Precautions / Restrictions Precautions Precautions: Fall;Posterior Hip Precaution Booklet Issued: Yes (comment) Precaution Comments: Reviewed precautions with pt verbally, and reinforced during session  Restrictions Weight Bearing Restrictions: No Other Position/Activity Restrictions: WBAT       Mobility  Bed Mobility Overal bed mobility: Needs Assistance Bed Mobility: Supine to Sit     Supine to sit: Min assist;HOB elevated     General bed mobility comments: Min assist for LE management, scooting to EOB, reinforcing posterior hip precautions.   Transfers Overall transfer level: Needs assistance Equipment used: Rolling walker (2 wheeled) Transfers: Sit to/from Stand Sit to Stand: Min assist;From elevated surface         General transfer comment: Min assist for power up and  steadying upon standing. Verbal cuing for hand placement.   Ambulation/Gait Ambulation/Gait assistance: Min assist;+2 safety/equipment Gait Distance (Feet): 5 Feet Assistive device: Rolling walker (2 wheeled) Gait Pattern/deviations: Step-to pattern;Decreased stride length;Decreased stance time - left;Decreased weight shift to left;Antalgic Gait velocity: decr    General Gait Details: Pt with min assist for steadying, reinforcing precautions, sequencing, placement in RW. After 5 ft ambulation, pt stating she was dizzy. Recliner follow, so pt sat down. Pt encouraged pt to sit slowly and be careful with hip flexion restriction. BP 99/53 and HR of 56. PT discontinued further ambulation.   Stairs            Wheelchair Mobility    Modified Rankin (Stroke Patients Only)       Balance Overall balance assessment: Needs assistance Sitting-balance support: Bilateral upper extremity supported Sitting balance-Leahy Scale: Fair     Standing balance support: Bilateral upper extremity supported Standing balance-Leahy Scale: Poor                               Pertinent Vitals/Pain Pain Assessment: 0-10 Pain Score: 7  Pain Location: L hip  Pain Descriptors / Indicators: Aching;Sore;Throbbing Pain Intervention(s): Limited activity within patient's tolerance;Repositioned;Ice applied;Monitored during session;Premedicated before session    Home Living Family/patient expects to be discharged to:: Private residence Living Arrangements: Spouse/significant other Available Help at Discharge: Family;Available PRN/intermittently Type of Home: House Home Access: Level entry     Home Layout: One level Home Equipment: Bedside commode;Walker - 2 wheels;Shower seat;Cane - single point Additional Comments: has grab bars, not installed yet.     Prior Function Level of Independence:  Independent with assistive device(s)         Comments: used cane all the time, did all ADLs  independently and cared for husband (in need of bilateral knee replacements)      Hand Dominance   Dominant Hand: Right    Extremity/Trunk Assessment   Upper Extremity Assessment Upper Extremity Assessment: Generalized weakness    Lower Extremity Assessment Lower Extremity Assessment: Generalized weakness;LLE deficits/detail LLE Deficits / Details: suspected hip weakness; able to perform SLR x4 LLE Sensation: WNL    Cervical / Trunk Assessment Cervical / Trunk Assessment: Other exceptions Cervical / Trunk Exceptions: scoliosis  Communication   Communication: No difficulties  Cognition Arousal/Alertness: Awake/alert Behavior During Therapy: WFL for tasks assessed/performed Overall Cognitive Status: Within Functional Limits for tasks assessed                                        General Comments      Exercises Total Joint Exercises Ankle Circles/Pumps: AROM;Both;5 reps;Seated   Assessment/Plan    PT Assessment Patient needs continued PT services  PT Problem List Decreased strength;Pain;Decreased range of motion;Decreased activity tolerance;Decreased knowledge of use of DME;Decreased balance;Decreased safety awareness;Decreased mobility       PT Treatment Interventions DME instruction;Therapeutic activities;Gait training;Therapeutic exercise;Patient/family education;Balance training;Functional mobility training    PT Goals (Current goals can be found in the Care Plan section)  Acute Rehab PT Goals PT Goal Formulation: With patient Time For Goal Achievement: 12/22/17 Potential to Achieve Goals: Good    Frequency 7X/week   Barriers to discharge        Co-evaluation               AM-PAC PT "6 Clicks" Daily Activity  Outcome Measure Difficulty turning over in bed (including adjusting bedclothes, sheets and blankets)?: Unable Difficulty moving from lying on back to sitting on the side of the bed? : Unable Difficulty sitting down on and  standing up from a chair with arms (e.g., wheelchair, bedside commode, etc,.)?: Unable Help needed moving to and from a bed to chair (including a wheelchair)?: A Little Help needed walking in hospital room?: A Little Help needed climbing 3-5 steps with a railing? : A Lot 6 Click Score: 11    End of Session Equipment Utilized During Treatment: Gait belt Activity Tolerance: Patient tolerated treatment well Patient left: in chair;with chair alarm set;with call bell/phone within reach;with family/visitor present;with SCD's reapplied Nurse Communication: Mobility status PT Visit Diagnosis: Other abnormalities of gait and mobility (R26.89);Difficulty in walking, not elsewhere classified (R26.2)    Time: 1610-9604 PT Time Calculation (min) (ACUTE ONLY): 29 min   Charges:   PT Evaluation $PT Eval Low Complexity: 1 Low PT Treatments $Therapeutic Activity: 8-22 mins       Nicola Police, PT Acute Rehabilitation Services Pager [phone]  Office [phone]  Wetzel Meester D Despina Hidden 12/08/2017, 6:39 PM

## 2017-12-08 NOTE — Op Note (Signed)
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  PROCEDURE:  Left acetabular revision.  SURGEON:  Ollen Gross, MD  ASSISTANT:  Arther Abbott, PA-C  ANESTHESIA:  General.  ESTIMATED BLOOD LOSS:  300.  DRAINS:  Hemovac x1.  COMPLICATIONS:  None.  CONDITION:  Stable to recovery.  BRIEF CLINICAL NOTE:  The patient is an 83 year old female who had a total hip arthroplasty done several years ago elsewhere and has had recurrent dislocations.  She had a constrained liner placed and the constrained liner broke x2.  She presented to me  for consideration of revision surgery due to her recurrent instability and broken constrained liner.  She presents today for acetabular versus total hip revision.  PROCEDURE IN DETAIL:  After successful administration of general anesthetic, the patient was placed in the right lateral decubitus position with the left side up and held with a hip positioner.  Left lower extremity was isolated from her perineum with  plastic drapes and prepped and draped in the usual sterile fashion.  A short posterolateral incision was made with a 10 blade through subcutaneous tissue to the level of the fascia lata, which was incised in line with the skin incision.  The sciatic  nerve was palpated and protected.  Posterior pseudocapsule is incised and elevated off the femur.  There was some metal stained tissue present.  Part of the locking ring was identified and had been broken off.  This was removed.  We then removed the  metal stained tissue.  I was unable to access the joint and I dislocated the hip.  The rest of the  broken ring was identified and removed.  The femoral component was well fixed and in about 10 degrees of anteversion.  It was felt to be stable and did not  need revising.  I retracted the femur anteriorly to gain acetabular exposure.  Acetabular retractors were placed.  The femoral neck had been hitting against the posterior aspect of the acetabular liner impinging and that is how the ring broke.  The liner was also broken posteriorly.  I was able to remove the liner from the  acetabular shell.  The position of the acetabular shell was in significant anteversion, which was causing the impingement posteriorly and causing her to dislocate anteriorly.  I removed the screw from her acetabular shell and then using osteotomes  disrupted the interface between the acetabular shell and bone and then using the Moreland curved osteotome, I was able to remove the acetabular shell without any significant bone loss.  She had some anterior column deficit, but the rest of the acetabulum  was well intact.  There is a 54 mm acetabular shell that was removed.  Acetabular retractors were again placed and I started reaming at 51 mm up to 55 mm.  A 56 mm Pinnacle multihole acetabular shell was then placed.  It is impacted matching native  version in about 40 degrees of abduction and 20 degrees of anteversion.  We had decent purchase.  I impacted the shell and I placed 4 additional dome screws, which had excellent purchase.  I then threaded the impacter handle back into the shell and  tested the stability and the acetabular shell and pelvis were  moving as a single unit.  Thus, was a very stable shell.  I placed a trial 36 mm neutral +4 liner and a 36 mm head.  We started with a 36+5 head which did not have the appropriate soft tissue  tension.  The 36+12 had more appropriate tension and there was excellent stability with full extension, full external rotation, 70 degrees of flexion, 40 degrees of adduction 90 degrees of internal  rotation and 90 degrees of flexion and 70 degrees of  internal rotation.  I was very satisfied with the stability.  We then removed the trial liner and trial head.  The femur was again retracted anteriorly.  The 36 mm +4, 10-degree liner was then placed with the 10-degree wall at the 11 o'clock position on the acetabular shell.  This was impacted into position.  The 36+12 femoral head was placed and the hip was reduced with the same stability parameters.   There is absolutely no impingement of the neck on the acetabular liner.  Stability is the same as in the trial.  Wound was then copiously irrigated with saline solution and the posterior pseudocapsule reattached to the femur through drill holes with  Ethibond suture.  Fascia lata was closed over a Hemovac drain with a running 0 Stratafix suture.  Subcutaneous was closed with interrupted 2-0 Vicryl and subcuticular running 4-0 Monocryl.  The incision was cleaned and dried and Steri-Strips and a bulky  sterile dressing applied.    She was placed into a knee immobilizer, awakened and transported to recovery in stable condition.  Note that a surgical assistant was a medical necessity for this complex procedure.  Surgical assistant was necessary for retraction of vital neurovascular structures and also for proper positioning of the limb to gain access to the acetabulum for removal  of the old implant and for safe and accurate placement of the new implant.  AN/NUANCE  D:12/08/2017 T:12/08/2017 JOB:[id number]

## 2017-12-08 NOTE — Anesthesia Postprocedure Evaluation (Signed)
Anesthesia Post Note  Patient: Abigail Wolf  Procedure(s) Performed: Left Acetabular revision (Left Hip)     Patient location during evaluation: PACU Anesthesia Type: General Level of consciousness: awake and alert Pain control: Patient with significant postop pain, though states it's more in her back than at the surgical hip. Limited options for management given multitude of allergies. Patient becoming more comfortable following dilaudid. Vital Signs Assessment: post-procedure vital signs reviewed and stable Respiratory status: spontaneous breathing, nonlabored ventilation and respiratory function stable Cardiovascular status: blood pressure returned to baseline and stable Postop Assessment: no apparent nausea or vomiting Anesthetic complications: no    Last Vitals:  Vitals:   12/08/17 1300 12/08/17 1319  BP: (!) 106/59 94/60  Pulse: 65 (!) 59  Resp: 14 14  Temp: 36.5 C   SpO2: 100% 100%                 Beryle Lathe

## 2017-12-08 NOTE — Anesthesia Procedure Notes (Signed)
Procedure Name: Intubation Date/Time: 12/08/2017 8:32 AM Performed by: Maxwell Caul, CRNA Pre-anesthesia Checklist: Patient identified, Emergency Drugs available, Suction available and Patient being monitored Patient Re-evaluated:Patient Re-evaluated prior to induction Oxygen Delivery Method: Circle system utilized Preoxygenation: Pre-oxygenation with 100% oxygen Induction Type: IV induction Ventilation: Mask ventilation without difficulty Laryngoscope Size: Mac and 3 Grade View: Grade I Tube type: Oral Tube size: 7.0 mm Number of attempts: 1 Airway Equipment and Method: Stylet Placement Confirmation: ETT inserted through vocal cords under direct vision,  positive ETCO2 and breath sounds checked- equal and bilateral Secured at: 22 cm Tube secured with: Tape Dental Injury: Teeth and Oropharynx as per pre-operative assessment

## 2017-12-08 NOTE — Discharge Instructions (Signed)
Dr. Ollen Gross Total Joint Specialist [facility] [address], [address] [place], [place] [address] [phone]  ACETABULAR HIP REVISION POSTOPERATIVE DIRECTIONS  Hip Rehabilitation, Guidelines Following Surgery  The results of a hip operation are greatly improved after range of motion and muscle strengthening exercises. Follow all safety measures which are given to protect your hip. If any of these exercises cause increased pain or swelling in your joint, decrease the amount until you are comfortable again. Then slowly increase the exercises. Call your caregiver if you have problems or questions.   HOME CARE INSTRUCTIONS   Remove items at home which could result in a fall. This includes throw rugs or furniture in walking pathways.   ICE to the affected hip every three hours for 30 minutes at a time and then as needed for pain and swelling.  Continue to use ice on the hip for pain and swelling from surgery. You may notice swelling that will progress down to the foot and ankle.  This is normal after surgery.  Elevate the leg when you are not up walking on it.    Continue to use the breathing machine which will help keep your temperature down.  It is common for your temperature to cycle up and down following surgery, especially at night when you are not up moving around and exerting yourself.  The breathing machine keeps your lungs expanded and your temperature down.  DIET You may resume your previous home diet once your are discharged from the hospital.  DRESSING / WOUND CARE / SHOWERING You may shower 3 days after surgery, but keep the wounds dry during showering.  You may use an occlusive plastic wrap (Press'n Seal for example), NO SOAKING/SUBMERGING IN THE BATHTUB.  If the bandage gets wet, change with a clean dry gauze.  If the incision gets wet, pat the wound dry with a clean towel. You may start showering once you are discharged home but do not submerge the incision under  water. Just pat the incision dry and apply a dry gauze dressing on daily. Change the surgical dressing daily and reapply a dry dressing each time.  ACTIVITY Walk with your walker as instructed. Use walker as long as suggested by your caregivers. Avoid periods of inactivity such as sitting longer than an hour when not asleep. This helps prevent blood clots.  You may resume a sexual relationship in one month or when given the OK by your doctor.  You may return to work once you are cleared by your doctor.  Do not drive a car for 6 weeks or until released by you surgeon.  Do not drive while taking narcotics.  WEIGHT BEARING Weight bearing as tolerated with assist device (walker, cane, etc) as directed, use it as long as suggested by your surgeon or therapist, typically at least 4-6 weeks.  POSTOPERATIVE CONSTIPATION PROTOCOL Constipation - defined medically as fewer than three stools per week and severe constipation as less than one stool per week.  One of the most common issues patients have following surgery is constipation.  Even if you have a regular bowel pattern at home, your normal regimen is likely to be disrupted due to multiple reasons following surgery.  Combination of anesthesia, postoperative narcotics, change in appetite and fluid intake all can affect your bowels.  In order to avoid complications following surgery, here are some recommendations in order to help you during your recovery period.  Colace (docusate) - Pick up an over-the-counter form of Colace  or another stool softener and take twice a day as long as you are requiring postoperative pain medications.  Take with a full glass of water daily.  If you experience loose stools or diarrhea, hold the colace until you stool forms back up.  If your symptoms do not get better within 1 week or if they get worse, check with your doctor.  Dulcolax (bisacodyl) - Pick up over-the-counter and take as directed by the product packaging as  needed to assist with the movement of your bowels.  Take with a full glass of water.  Use this product as needed if not relieved by Colace only.   MiraLax (polyethylene glycol) - Pick up over-the-counter to have on hand.  MiraLax is a solution that will increase the amount of water in your bowels to assist with bowel movements.  Take as directed and can mix with a glass of water, juice, soda, coffee, or tea.  Take if you go more than two days without a movement. Do not use MiraLax more than once per day. Call your doctor if you are still constipated or irregular after using this medication for 7 days in a row.  If you continue to have problems with postoperative constipation, please contact the office for further assistance and recommendations.  If you experience "the worst abdominal pain ever" or develop nausea or vomiting, please contact the office immediatly for further recommendations for treatment.  ITCHING  If you experience itching with your medications, try taking only a single pain pill, or even half a pain pill at a time.  You can also use Benadryl over the counter for itching or also to help with sleep.   TED HOSE STOCKINGS Wear the elastic stockings on both legs for three weeks following surgery during the day but you may remove then at night for sleeping.  MEDICATIONS See your medication summary on the After Visit Summary that the nursing staff will review with you prior to discharge.  You may have some home medications which will be placed on hold until you complete the course of blood thinner medication.  It is important for you to complete the blood thinner medication as prescribed by your surgeon.  Continue your approved medications as instructed at time of discharge.  PRECAUTIONS If you experience chest pain or shortness of breath - call 911 immediately for transfer to the hospital emergency department.  If you develop a fever greater that 101 F, purulent drainage from wound,  increased redness or drainage from wound, foul odor from the wound/dressing, or calf pain - CONTACT YOUR SURGEON.                                                   FOLLOW-UP APPOINTMENTS Make sure you keep all of your appointments after your operation with your surgeon and caregivers. You should call the office at the above phone number and make an appointment for approximately two weeks after the date of your surgery or on the date instructed by your surgeon outlined in the "After Visit Summary".  RANGE OF MOTION AND STRENGTHENING EXERCISES  These exercises are designed to help you keep full movement of your hip joint. Follow your caregiver's or physical therapist's instructions. Perform all exercises about fifteen times, three times per day or as directed. Exercise both hips, even if you have had only  one joint replacement. These exercises can be done on a training (exercise) mat, on the floor, on a table or on a bed. Use whatever works the best and is most comfortable for you. Use music or television while you are exercising so that the exercises are a pleasant break in your day. This will make your life better with the exercises acting as a break in routine you can look forward to.   Lying on your back, slowly slide your foot toward your buttocks, raising your knee up off the floor. Then slowly slide your foot back down until your leg is straight again.   Lying on your back spread your legs as far apart as you can without causing discomfort.   Lying on your side, raise your upper leg and foot straight up from the floor as far as is comfortable. Slowly lower the leg and repeat.   Lying on your back, tighten up the muscle in the front of your thigh (quadriceps muscles). You can do this by keeping your leg straight and trying to raise your heel off the floor. This helps strengthen the largest muscle supporting your knee.   Lying on your back, tighten up the muscles of your buttocks both with the  legs straight and with the knee bent at a comfortable angle while keeping your heel on the floor.   IF YOU ARE TRANSFERRED TO A SKILLED REHAB FACILITY If the patient is transferred to a skilled rehab facility following release from the hospital, a list of the current medications will be sent to the facility for the patient to continue.  When discharged from the skilled rehab facility, please have the facility set up the patient's [place] prior to being released. Also, the skilled facility will be responsible for providing the patient with their medications at time of release from the facility to include their pain medication, the muscle relaxants, and their blood thinner medication. If the patient is still at the rehab facility at time of the two week follow up appointment, the skilled rehab facility will also need to assist the patient in arranging follow up appointment in our office and any transportation needs.  MAKE SURE YOU:   Understand these instructions.   Get help right away if you are not doing well or get worse.    Pick up stool softner and laxative for home use following surgery while on pain medications. Do not submerge incision under water. Please use good hand washing techniques while changing dressing each day. May shower starting three days after surgery. Please use a clean towel to pat the incision dry following showers. Continue to use ice for pain and swelling after surgery. Do not use any lotions or creams on the incision until instructed by your surgeon.

## 2017-12-08 NOTE — Progress Notes (Signed)
Ring on left hand taped by Vernona Rieger, CRNA.

## 2017-12-09 ENCOUNTER — Encounter (HOSPITAL_COMMUNITY): Payer: Self-pay | Admitting: Orthopedic Surgery

## 2017-12-09 LAB — BASIC METABOLIC PANEL
Anion gap: 5 (ref 5–15)
BUN: 10 mg/dL (ref 8–23)
CALCIUM: 8.4 mg/dL — AB (ref 8.9–10.3)
CO2: 25 mmol/L (ref 22–32)
Chloride: 106 mmol/L (ref 98–111)
Creatinine, Ser: 0.6 mg/dL (ref 0.44–1.00)
GFR calc Af Amer: >60 mL/min (ref >60)
GFR calc non Af Amer: >60 mL/min (ref >60)
GLUCOSE: 123 mg/dL — AB (ref 70–99)
Potassium: 4.2 mmol/L (ref 3.5–5.1)
Sodium: 136 mmol/L (ref 135–145)

## 2017-12-09 LAB — CBC
HEMATOCRIT: 25.7 % — AB (ref 36.0–46.0)
Hemoglobin: 8.2 g/dL — ABNORMAL LOW (ref 12.0–15.0)
MCH: 31.3 pg (ref 26.0–34.0)
MCHC: 31.9 g/dL (ref 30.0–36.0)
MCV: 98.1 fL (ref 80.0–100.0)
NRBC: 0 % (ref 0.0–0.2)
Platelets: 234 10*3/uL (ref 150–400)
RBC: 2.62 MIL/uL — ABNORMAL LOW (ref 3.87–5.11)
RDW: 14.2 % (ref 11.5–15.5)
WBC: 13.5 10*3/uL — ABNORMAL HIGH (ref 4.0–10.5)

## 2017-12-09 NOTE — Progress Notes (Signed)
   Subjective: 1 Day Post-Op Procedure(s) (LRB): Left Acetabular revision (Left) Patient reports pain as moderate.   Patient seen in rounds by Dr. Lequita Halt. Patient is well, and has had no acute complaints or problems other than pain in the left hip. Denies chest pain or SOB. Foley catheter removed this AM. No issues overnight.  We will continue therapy today.   Objective: Vital signs in last 24 hours: Temp:  [96.5 F (35.8 C)-98.6 F (37 C)] 97.6 F (36.4 C) (10/17 0519) Pulse Rate:  [51-84] 59 (10/17 0519) Resp:  [8-20] 17 (10/17 0519) BP: (94-173)/(48-81) 120/48 (10/17 0519) SpO2:  [97 %-100 %] 100 % (10/17 0519)  Intake/Output from previous day:  Intake/Output Summary (Last 24 hours) at 12/09/2017 0736 Last data filed at 12/09/2017 0600 Gross per 24 hour  Intake 4180.6 ml  Output 2650 ml  Net 1530.6 ml    Labs: Recent Labs    12/09/17 0518  HGB 8.2*   Recent Labs    12/09/17 0518  WBC 13.5*  RBC 2.62*  HCT 25.7*  PLT 234   Recent Labs    12/09/17 0518  NA 136  K 4.2  CL 106  CO2 25  BUN 10  CREATININE 0.60  GLUCOSE 123*  CALCIUM 8.4*   Exam: General - Patient is Alert and Oriented Extremity - Neurologically intact Neurovascular intact Sensation intact distally Dorsiflexion/Plantar flexion intact Dressing - dressing C/D/I Motor Function - intact, moving foot and toes well on exam.   Past Medical History:  Diagnosis Date  . Anxiety   . Elevated cholesterol   . GERD (gastroesophageal reflux disease)   . History of hiatal hernia   . Hypertension   . Scoliosis     Assessment/Plan: 1 Day Post-Op Procedure(s) (LRB): Left Acetabular revision (Left) Principal Problem:   Failed total hip arthroplasty (HCC)  Estimated body mass index is 24.04 kg/m as calculated from the following:   Height as of this encounter: 4\' 11"  (1.499 m).   Weight as of this encounter: 54 kg. Advance diet Up with therapy  DVT Prophylaxis - Aspirin Weight bearing  as tolerated D/C knee immobilizer Hemovac pulled without difficulty, will continue therapy today. Hip precautions discussed with patient  Plan is to go Home after hospital stay. Plan for discharge tomorrow or Saturday pending progress with therapy with home health physical therapy.  Arther Abbott, PA-C Orthopedic Surgery 12/09/2017, 7:36 AM

## 2017-12-09 NOTE — Plan of Care (Signed)
Pt is stable. Pain management in progress, effective. Plan of care reviewed. Problem: Education: Goal: Knowledge of the prescribed therapeutic regimen will improve Outcome: Progressing   Problem: Activity: Goal: Ability to avoid complications of mobility impairment will improve Outcome: Progressing Goal: Range of joint motion will improve Outcome: Progressing   Problem: Clinical Measurements: Goal: Postoperative complications will be avoided or minimized Outcome: Progressing   Problem: Pain Management: Goal: Pain level will decrease with appropriate interventions Outcome: Progressing   Problem: Skin Integrity: Goal: Will show signs of wound healing Outcome: Progressing

## 2017-12-09 NOTE — Progress Notes (Signed)
PT Cancellation Note  Patient Details Name: Abigail Wolf MRN: [id number] DOB: 10/20/1934   Cancelled Treatment:     pt adamantly declined Physical Therapy this afternoon.  "I'm hurting too bad, I'm not able".  Attempted to educate on importence of mobility after surgery.  Pt appeared uninterested.     Felecia Shelling  PTA Acute  Rehabilitation Services Pager      [phone] Office      [phone]

## 2017-12-09 NOTE — Care Management Note (Signed)
Case Management Note  Patient Details  Name: CAILI ESCALERA MRN: [id number] Date of Birth: [birth year]  Subjective/Objective:      Discharge planning, spoke with patient at beside. Chose [facility] for [facility] services, PT to eval and treat.          Action/Plan: Contacted [facility] for referral. Has RW and 3-n-1. [phone]      Expected Discharge Date:                  Expected Discharge Plan:  Home w Home Health Services  In-House Referral:  NA  Discharge planning Services  CM Consult  Post Acute Care Choice:  Home Health Choice offered to:  Patient  DME Arranged:  N/A DME Agency:  NA  HH Arranged:  PT HH Agency:  [facility]  Status of Service:  Completed, signed off  If discussed at Long Length of Stay Meetings, dates discussed:    Additional Comments:  Alexis Goodell, RN 12/09/2017, 11:13 AM

## 2017-12-09 NOTE — Progress Notes (Signed)
Physical Therapy Treatment Patient Details Name: Abigail Wolf MRN: [id number] DOB: [birth year] Today's Date: 12/09/2017    History of Present Illness 83 YO female s/p L THA acetabular revision, posterior approach on 12/08/17. PMH includes L THA with recurrent dislocations, scoliosis, HTN, hiatial hernia, GERD, anxiety, back surgery, R TKR 2012, cataracts.     PT Comments    POD # 1 am session. Assisted out of recliner to attempt amb however self limited by pain and anxiety.  General Gait Details: required increased time and MAX encouragement.  Pt present with increased anxiety with increased distance.  "No I can't go any further, it hurts too bad".  Assisted with pivoting around to return to recliner.  Required VC's safety with turns and to control desend.  Positioned to comfort and applied ICE.    Follow Up Recommendations  Follow surgeon's recommendation for DC plan and follow-up therapies;Supervision for mobility/OOB     Equipment Recommendations  None recommended by PT    Recommendations for Other Services       Precautions / Restrictions Precautions Precautions: Fall;Posterior Hip Precaution Comments: Reviewed precautions with pt verbally, and reinforced during session  Restrictions Weight Bearing Restrictions: No Other Position/Activity Restrictions: WBAT     Mobility  Bed Mobility               General bed mobility comments: OOB in recliner  Transfers Overall transfer level: Needs assistance Equipment used: Rolling walker (2 wheeled) Transfers: Sit to/from [employer] Transfers Sit to Stand: Min assist;From elevated surface Stand pivot transfers: Min assist;Mod assist       General transfer comment: Min assist for power up and steadying upon standing. Verbal cuing to avoid hip flex > 90 degrees  Ambulation/Gait Ambulation/Gait assistance: Min assist Gait Distance (Feet): 9 Feet Assistive device: Rolling walker (2 wheeled) Gait Pattern/deviations:  Step-to pattern;Decreased stride length;Decreased stance time - left;Decreased weight shift to left;Antalgic     General Gait Details: required increased time and MAX encouragement.  Pt present with increased anxiety with increased distance.  "No I can't go any further, it hurts too bad".  Assisted with pivoting around to return to recliner.  Required VC's safety with turns and to control desend.     Stairs             Wheelchair Mobility    Modified Rankin (Stroke Patients Only)       Balance                                            Cognition Arousal/Alertness: Awake/alert Behavior During Therapy: WFL for tasks assessed/performed Overall Cognitive Status: Within Functional Limits for tasks assessed                                 General Comments: high anxiety with increased pain      Exercises      General Comments        Pertinent Vitals/Pain Pain Assessment: 0-10 Pain Score: 8  Pain Location: L hip  Pain Descriptors / Indicators: Aching;Sore;Throbbing;Operative site guarding Pain Intervention(s): Monitored during session;Repositioned;Premedicated before session;Ice applied    Home Living                      Prior Function  PT Goals (current goals can now be found in the care plan section) Progress towards PT goals: Progressing toward goals    Frequency    7X/week      PT Plan Current plan remains appropriate    Co-evaluation              AM-PAC PT "6 Clicks" Daily Activity  Outcome Measure  Difficulty turning over in bed (including adjusting bedclothes, sheets and blankets)?: A Lot Difficulty moving from lying on back to sitting on the side of the bed? : A Lot Difficulty sitting down on and standing up from a chair with arms (e.g., wheelchair, bedside commode, etc,.)?: A Lot Help needed moving to and from a bed to chair (including a wheelchair)?: A Lot Help needed walking in  hospital room?: A Lot Help needed climbing 3-5 steps with a railing? : Total 6 Click Score: 11    End of Session Equipment Utilized During Treatment: Gait belt Activity Tolerance: Other (comment)(self limited pain and anxiety) Patient left: in chair;with chair alarm set;with call bell/phone within reach;with family/visitor present;with SCD's reapplied Nurse Communication: Mobility status PT Visit Diagnosis: Other abnormalities of gait and mobility (R26.89);Difficulty in walking, not elsewhere classified (R26.2)     Time: 0955-1010 PT Time Calculation (min) (ACUTE ONLY): 15 min  Charges:  $Gait Training: 8-22 mins                     Felecia Shelling  PTA Acute  Rehabilitation Services Pager      [phone] Office      [phone]

## 2017-12-10 LAB — CBC
HEMATOCRIT: 21.8 % — AB (ref 36.0–46.0)
Hemoglobin: 6.9 g/dL — CL (ref 12.0–15.0)
MCH: 31.5 pg (ref 26.0–34.0)
MCHC: 31.7 g/dL (ref 30.0–36.0)
MCV: 99.5 fL (ref 80.0–100.0)
NRBC: 0 % (ref 0.0–0.2)
PLATELETS: 195 10*3/uL (ref 150–400)
RBC: 2.19 MIL/uL — AB (ref 3.87–5.11)
RDW: 14.6 % (ref 11.5–15.5)
WBC: 12.7 10*3/uL — ABNORMAL HIGH (ref 4.0–10.5)

## 2017-12-10 LAB — BASIC METABOLIC PANEL
ANION GAP: 6 (ref 5–15)
BUN: 12 mg/dL (ref 8–23)
CO2: 24 mmol/L (ref 22–32)
Calcium: 8.4 mg/dL — ABNORMAL LOW (ref 8.9–10.3)
Chloride: 107 mmol/L (ref 98–111)
Creatinine, Ser: 0.57 mg/dL (ref 0.44–1.00)
GLUCOSE: 122 mg/dL — AB (ref 70–99)
POTASSIUM: 4.4 mmol/L (ref 3.5–5.1)
Sodium: 137 mmol/L (ref 135–145)

## 2017-12-10 LAB — HEMOGLOBIN AND HEMATOCRIT, BLOOD
HCT: 31.4 % — ABNORMAL LOW (ref 36.0–46.0)
Hemoglobin: 10.2 g/dL — ABNORMAL LOW (ref 12.0–15.0)

## 2017-12-10 LAB — PREPARE RBC (CROSSMATCH)

## 2017-12-10 MED ORDER — SODIUM CHLORIDE 0.9% IV SOLUTION: Freq: Once | INTRAVENOUS | Status: DC

## 2017-12-10 MED ORDER — ASPIRIN 325 MG PO TBEC: 325.0000 mg | DELAYED_RELEASE_TABLET | Freq: Two times a day (BID) | ORAL | 0 refills | Status: AC

## 2017-12-10 MED ORDER — METHOCARBAMOL 500 MG PO TABS: 500.0000 mg | ORAL_TABLET | Freq: Four times a day (QID) | ORAL | 0 refills | Status: AC | PRN

## 2017-12-10 MED ORDER — HYDROCODONE-ACETAMINOPHEN 5-325 MG PO TABS: 1.0000 | ORAL_TABLET | Freq: Four times a day (QID) | ORAL | 0 refills | Status: AC | PRN

## 2017-12-10 NOTE — Progress Notes (Signed)
Physical Therapy Treatment Patient Details Name: Abigail Wolf MRN: [id number] DOB: [birth year] Today's Date: 12/10/2017    History of Present Illness 83 YO female s/p L THA acetabular revision, posterior approach on 12/08/17. PMH includes L THA with recurrent dislocations, scoliosis, HTN, hiatial hernia, GERD, anxiety, back surgery, R TKR 2012, cataracts.     PT Comments    POD # 2 Am session withheld HgB 6.9 2 units ordered PM session pt tolerated an amb distance with no anxiety this session.  Required increased time.  Assisted back to bed per pt request.  Pt plans to D/C to home tomorrow with spouse.  No stairs to enter home.    Follow Up Recommendations  Follow surgeon's recommendation for DC plan and follow-up therapies;Supervision for mobility/OOB     Equipment Recommendations  None recommended by PT    Recommendations for Other Services       Precautions / Restrictions Precautions Precautions: Fall;Posterior Hip Precaution Comments: Reviewed precautions with pt verbally, and reinforced during session  Restrictions Weight Bearing Restrictions: No Other Position/Activity Restrictions: WBAT     Mobility  Bed Mobility Overal bed mobility: Needs Assistance Bed Mobility: Sit to Supine       Sit to supine: Min assist   General bed mobility comments: assist L LE up onto bed while adhering to THP  Transfers Overall transfer level: Needs assistance Equipment used: Rolling walker (2 wheeled) Transfers: Sit to/from [employer] Sit to Stand: Supervision;Min guard Stand pivot transfers: Supervision;Min guard       General transfer comment: one VC safety with turn completion  Ambulation/Gait Ambulation/Gait assistance: Supervision;Min guard Gait Distance (Feet): 135 Feet Assistive device: Rolling walker (2 wheeled) Gait Pattern/deviations: Step-to pattern;Decreased stride length;Decreased stance time - left;Decreased weight shift to left;Antalgic Gait  velocity: decreased but functional   General Gait Details: tolerated an increased distance with no anxiety.     Stairs             Wheelchair Mobility    Modified Rankin (Stroke Patients Only)       Balance                                            Cognition Arousal/Alertness: Awake/alert Behavior During Therapy: WFL for tasks assessed/performed Overall Cognitive Status: Within Functional Limits for tasks assessed                                 General Comments: less anxiety and feeling better       Exercises      General Comments        Pertinent Vitals/Pain Pain Assessment: 0-10 Pain Score: 5  Pain Location: L hip  Pain Descriptors / Indicators: Aching;Sore;Throbbing;Operative site guarding Pain Intervention(s): Monitored during session;Repositioned;Premedicated before session    Home Living                      Prior Function            PT Goals (current goals can now be found in the care plan section) Progress towards PT goals: Progressing toward goals    Frequency    7X/week      PT Plan Current plan remains appropriate    Co-evaluation  AM-PAC PT "6 Clicks" Daily Activity  Outcome Measure  Difficulty turning over in bed (including adjusting bedclothes, sheets and blankets)?: A Little Difficulty moving from lying on back to sitting on the side of the bed? : A Little Difficulty sitting down on and standing up from a chair with arms (e.g., wheelchair, bedside commode, etc,.)?: A Little Help needed moving to and from a bed to chair (including a wheelchair)?: A Little Help needed walking in hospital room?: A Little Help needed climbing 3-5 steps with a railing? : A Lot 6 Click Score: 17    End of Session Equipment Utilized During Treatment: Gait belt Activity Tolerance: Patient tolerated treatment well Patient left: in bed;with bed alarm set;with call bell/phone within  reach Nurse Communication: Mobility status PT Visit Diagnosis: Other abnormalities of gait and mobility (R26.89);Difficulty in walking, not elsewhere classified (R26.2)     Time: 1610-9604 PT Time Calculation (min) (ACUTE ONLY): 24 min  Charges:  $Gait Training: 8-22 mins $Therapeutic Activity: 8-22 mins                     Felecia Shelling  PTA Acute  Rehabilitation Services Pager      [phone] Office      [phone]

## 2017-12-10 NOTE — Progress Notes (Signed)
CRITICAL VALUE ALERT  Critical Value:  Hgb: 6.9  Date & Time Notied:  12/10/2017 @ 0525  Provider Notified: Alphonsa Overall, PA-C paged @ [phone]  Orders Received/Actions taken: T&C, and transfuse 2 units PRBCs.

## 2017-12-10 NOTE — Progress Notes (Addendum)
   Subjective: 2 Days Post-Op Procedure(s) (LRB): Left Acetabular revision (Left) Patient reports pain as mild.   Patient seen in rounds with Dr. Lequita Halt. Patient is well, and has had no acute complaints or problems. Hgb dropped to 6.9 this AM, 2 units PRBCs ordered. Left hip pain is improved this AM. Denies chest pain, SOB, or calf pain. Voiding without difficulty and positive flatus.  Plan is to go Home after hospital stay.  Objective: Vital signs in last 24 hours: Temp:  [98 F (36.7 C)-99.4 F (37.4 C)] 98 F (36.7 C) (10/18 0652) Pulse Rate:  [44-60] 44 (10/18 0652) Resp:  [16-17] 16 (10/18 0630) BP: (107-149)/(39-99) 115/39 (10/18 0652) SpO2:  [95 %-100 %] 100 % (10/18 0652)  Intake/Output from previous day:  Intake/Output Summary (Last 24 hours) at 12/10/2017 0733 Last data filed at 12/10/2017 0630 Gross per 24 hour  Intake 1244.27 ml  Output 1350 ml  Net -105.73 ml    Labs: Recent Labs    12/09/17 0518 12/10/17 0440  HGB 8.2* 6.9*   Recent Labs    12/09/17 0518 12/10/17 0440  WBC 13.5* 12.7*  RBC 2.62* 2.19*  HCT 25.7* 21.8*  PLT 234 195   Recent Labs    12/09/17 0518 12/10/17 0440  NA 136 137  K 4.2 4.4  CL 106 107  CO2 25 24  BUN 10 12  CREATININE 0.60 0.57  GLUCOSE 123* 122*  CALCIUM 8.4* 8.4*   Exam: General - Patient is Alert and Oriented Extremity - Neurologically intact Neurovascular intact Sensation intact distally Dorsiflexion/Plantar flexion intact Dressing/Incision - clean, dry, no drainage Motor Function - intact, moving foot and toes well on exam.   Past Medical History:  Diagnosis Date  . Anxiety   . Elevated cholesterol   . GERD (gastroesophageal reflux disease)   . History of hiatal hernia   . Hypertension   . Scoliosis     Assessment/Plan: 2 Days Post-Op Procedure(s) (LRB): Left Acetabular revision (Left) Principal Problem:   Failed total hip arthroplasty (HCC)  Estimated body mass index is 24.04 kg/m as  calculated from the following:   Height as of this encounter: 4\' 11"  (1.499 m).   Weight as of this encounter: 54 kg. Up with therapy  DVT Prophylaxis - Aspirin Weight-bearing as tolerated  Transfusing 2 units PRBCs. CBC ordered for tomorrow AM. Will work with therapy today, plan for discharge tomorrow to home with HHPT if progresses with therapy and hemoglobin stable. Otherwise will discharge to home on Sunday. Follow-up in the office in 2 weeks with Dr. Lequita Halt.  Arther Abbott, PA-C Orthopedic Surgery 12/10/2017, 7:33 AM

## 2017-12-10 NOTE — Plan of Care (Signed)
Plan of care reviewed and discussed with patient.  Verbalized u/o same.

## 2017-12-11 LAB — TYPE AND SCREEN
ABO/RH(D): A POS
ANTIBODY SCREEN: NEGATIVE
UNIT DIVISION: 0
Unit division: 0

## 2017-12-11 LAB — CBC
HCT: 31.5 % — ABNORMAL LOW (ref 36.0–46.0)
HEMOGLOBIN: 10.1 g/dL — AB (ref 12.0–15.0)
MCH: 30.8 pg (ref 26.0–34.0)
MCHC: 32.1 g/dL (ref 30.0–36.0)
MCV: 96 fL (ref 80.0–100.0)
NRBC: 0 % (ref 0.0–0.2)
Platelets: 185 10*3/uL (ref 150–400)
RBC: 3.28 MIL/uL — AB (ref 3.87–5.11)
RDW: 15.2 % (ref 11.5–15.5)
WBC: 8.4 10*3/uL (ref 4.0–10.5)

## 2017-12-11 LAB — BPAM RBC
BLOOD PRODUCT EXPIRATION DATE: 201911172359
Blood Product Expiration Date: 201911172359
ISSUE DATE / TIME: 201910180624
ISSUE DATE / TIME: 201910181010
UNIT TYPE AND RH: 6200
UNIT TYPE AND RH: 6200

## 2017-12-11 NOTE — Progress Notes (Signed)
Physical Therapy Treatment Patient Details Name: Abigail Wolf MRN: [id number] DOB: 06-15-1934 Today's Date: 12/11/2017    History of Present Illness 83 YO female s/p L THA acetabular revision, posterior approach on 12/08/17. PMH includes L THA with recurrent dislocations, scoliosis, HTN, hiatial hernia, GERD, anxiety, back surgery, R TKR 2012, cataracts.     PT Comments    Pt progressing well, incr distance tolerance today; continues to require cues for THP during functional mobility, can verbalize 3/3 THP; pt feels ready for d/c, will benefit from HHPT  Follow Up Recommendations  Follow surgeon's recommendation for DC plan and follow-up therapies;Supervision for mobility/OOB     Equipment Recommendations  None recommended by PT    Recommendations for Other Services       Precautions / Restrictions Precautions Precautions: Fall;Posterior Hip Precaution Booklet Issued: Yes (comment) Precaution Comments: Reviewed precautions with pt verbally, and reinforced during session  Restrictions Weight Bearing Restrictions: No Other Position/Activity Restrictions: WBAT     Mobility  Bed Mobility Overal bed mobility: Needs Assistance Bed Mobility: Supine to Sit     Supine to sit: Supervision     General bed mobility comments: cues to adhere to THP  Transfers Overall transfer level: Needs assistance Equipment used: Rolling walker (2 wheeled) Transfers: Sit to/from Stand Sit to Stand: Supervision;Min guard         General transfer comment: cues for THP, hand placement  Ambulation/Gait Ambulation/Gait assistance: Supervision;Min guard Gait Distance (Feet): 200 Feet Assistive device: Rolling walker (2 wheeled) Gait Pattern/deviations: Step-to pattern;Decreased stance time - left;Decreased weight shift to left;Antalgic     General Gait Details: tolerated an increased distance, cues for THP with turns   Stairs             Wheelchair Mobility    Modified Rankin  (Stroke Patients Only)       Balance     Sitting balance-Leahy Scale: Good     Standing balance support: Bilateral upper extremity supported Standing balance-Leahy Scale: Poor(lightly reliant on UEs for balance)                              Cognition Arousal/Alertness: Awake/alert Behavior During Therapy: WFL for tasks assessed/performed Overall Cognitive Status: Within Functional Limits for tasks assessed                                        Exercises      General Comments        Pertinent Vitals/Pain Pain Assessment: 0-10 Pain Score: 1  Pain Location: L hip  Pain Descriptors / Indicators: Sore Pain Intervention(s): Limited activity within patient's tolerance;Monitored during session;Ice applied    Home Living                      Prior Function            PT Goals (current goals can now be found in the care plan section) Acute Rehab PT Goals PT Goal Formulation: With patient Time For Goal Achievement: 12/22/17 Potential to Achieve Goals: Good Progress towards PT goals: Progressing toward goals    Frequency    7X/week      PT Plan Current plan remains appropriate    Co-evaluation              AM-PAC PT "6 Clicks" Daily Activity  Outcome Measure  Difficulty turning over in bed (including adjusting bedclothes, sheets and blankets)?: A Little Difficulty moving from lying on back to sitting on the side of the bed? : A Little Difficulty sitting down on and standing up from a chair with arms (e.g., wheelchair, bedside commode, etc,.)?: A Little Help needed moving to and from a bed to chair (including a wheelchair)?: A Little Help needed walking in hospital room?: A Little Help needed climbing 3-5 steps with a railing? : A Little 6 Click Score: 18    End of Session Equipment Utilized During Treatment: Gait belt Activity Tolerance: Patient tolerated treatment well Patient left: in chair;with call  bell/phone within reach   PT Visit Diagnosis: Other abnormalities of gait and mobility (R26.89);Difficulty in walking, not elsewhere classified (R26.2)     Time: 6962-9528 PT Time Calculation (min) (ACUTE ONLY): 11 min  Charges:  $Gait Training: 8-22 mins                     Drucilla Chalet, PT  Pager: [phone] Acute Rehab Dept [facility]): [phone]   12/11/2017    [facility] 12/11/2017, 11:20 AM

## 2017-12-11 NOTE — Plan of Care (Signed)
Plan of care reviewed and discussed with patient.   

## 2017-12-11 NOTE — Progress Notes (Signed)
Subjective: 3 Days Post-Op Procedure(s) (LRB): Left Acetabular revision (Left) Patient reports pain as 3 on 0-10 scale.    Objective: Vital signs in last 24 hours: Temp:  [98 F (36.7 C)-98.6 F (37 C)] 98 F (36.7 C) (10/18 2354) Pulse Rate:  [45-59] 46 (10/18 2354) Resp:  [16] 16 (10/18 2354) BP: (124-147)/(58-62) 147/62 (10/18 2354) SpO2:  [98 %-100 %] 98 % (10/18 2354)  Intake/Output from previous day: 10/18 0701 - 10/19 0700 In: 895 [P.O.:260; Blood:635] Out: 1175 [Urine:1175] Intake/Output this shift: No intake/output data recorded.  Recent Labs    12/09/17 0518 12/10/17 0440 12/10/17 1559 12/11/17 0439  HGB 8.2* 6.9* 10.2* 10.1*   Recent Labs    12/10/17 0440 12/10/17 1559 12/11/17 0439  WBC 12.7*  --  8.4  RBC 2.19*  --  3.28*  HCT 21.8* 31.4* 31.5*  PLT 195  --  185   Recent Labs    12/09/17 0518 12/10/17 0440  NA 136 137  K 4.2 4.4  CL 106 107  CO2 25 24  BUN 10 12  CREATININE 0.60 0.57  GLUCOSE 123* 122*  CALCIUM 8.4* 8.4*   No results for input(s): LABPT, INR in the last 72 hours.  Neurologically intact ABD soft Sensation intact distally Intact pulses distally Dorsiflexion/Plantar flexion intact Incision: moderate drainage Compartment soft No dvt   Assessment/Plan: 3 Days Post-Op Procedure(s) (LRB): Left Acetabular revision (Left) Advance diet Up with therapy D/C IV fluids Discharge home with home health  Dressing change prior Instr given    Akaisha Truman C 12/11/2017, 7:50 AM

## 2017-12-20 NOTE — Discharge Summary (Signed)
Physician Discharge Summary   Patient ID: Abigail Wolf MRN: [id number] DOB/AGE: 1935/01/21 84 y.o.  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 Anxiety   . Elevated cholesterol   . GERD (gastroesophageal reflux disease)   . History of hiatal hernia   . Hypertension   . Scoliosis    Discharge Diagnoses:   Principal Problem:   Failed total hip arthroplasty ([place])  Estimated body mass index is 24.04 kg/m as calculated from the following:   Height as of this encounter: '4\' 11"'  (1.499 m).   Weight as of this encounter: 54 kg.  Procedure:  Procedure(s) (LRB): Left Acetabular revision (Left)   Consults: None  HPI: The patient is an 83 year old female who had a total hip arthroplasty done several years ago elsewhere and has had recurrent dislocations.  She had a constrained liner placed and the constrained liner broke x2.  She presented to me for consideration of revision surgery due to her recurrent instability and broken constrained liner.  She presents today for acetabular versus total hip revision.  Laboratory Data: Admission on 12/08/2017, Discharged on 12/11/2017  Component Date Value Ref Range Status  . WBC 12/09/2017 13.5* 4.0 - 10.5 K/uL Final  . RBC 12/09/2017 2.62* 3.87 - 5.11 MIL/uL Final  . Hemoglobin 12/09/2017 8.2* 12.0 - 15.0 g/dL Final  . HCT 12/09/2017 25.7* 36.0 - 46.0 % Final  . MCV 12/09/2017 98.1  80.0 - 100.0 fL Final  . MCH 12/09/2017 31.3  26.0 - 34.0 pg Final  . MCHC 12/09/2017 31.9  30.0 - 36.0 g/dL Final  . RDW 12/09/2017 14.2  11.5 - 15.5 % Final  . Platelets 12/09/2017 234  150 - 400 K/uL Final  . nRBC 12/09/2017 0.0  0.0 - 0.2 % Final   Performed at [facility], [place] [address] [place], [place] [address]  . Sodium 12/09/2017 136  135 - 145 mmol/L Final  . Potassium  12/09/2017 4.2  3.5 - 5.1 mmol/L Final  . Chloride 12/09/2017 106  98 - 111 mmol/L Final  . CO2 12/09/2017 25  22 - 32 mmol/L Final  . Glucose, Bld 12/09/2017 123* 70 - 99 mg/dL Final  . BUN 12/09/2017 10  8 - 23 mg/dL Final  . Creatinine, Ser 12/09/2017 0.60  0.44 - 1.00 mg/dL Final  . Calcium 12/09/2017 8.4* 8.9 - 10.3 mg/dL Final  . GFR calc non Af Amer 12/09/2017 >60  >60 mL/min Final  . GFR calc Af Amer 12/09/2017 >60  >60 mL/min Final   Comment: (NOTE) The eGFR has been calculated using the CKD EPI equation. This calculation has not been validated in all clinical situations. eGFR's persistently <60 mL/min signify possible Chronic Kidney Disease.   Georgiann Hahn gap 12/09/2017 5  5 - 15 Final   Performed at [facility], [place] [address], [place], [place] [address]  . WBC 12/10/2017 12.7* 4.0 - 10.5 K/uL Final  . RBC 12/10/2017 2.19* 3.87 - 5.11 MIL/uL Final  . Hemoglobin 12/10/2017 6.9* 12.0 - 15.0 g/dL Final   This result has been called to YARBOROUGH,B. RN by Raelyn Ensign on 10 18 2019 at 0521, and has been read back. CRITICAL RESULT VERIFIED  . HCT 12/10/2017 21.8* 36.0 - 46.0 % Final  . MCV 12/10/2017 99.5  80.0 - 100.0 fL Final  . MCH 12/10/2017 31.5  26.0 -  34.0 pg Final  . MCHC 12/10/2017 31.7  30.0 - 36.0 g/dL Final  . RDW 12/10/2017 14.6  11.5 - 15.5 % Final  . Platelets 12/10/2017 195  150 - 400 K/uL Final  . nRBC 12/10/2017 0.0  0.0 - 0.2 % Final   Performed at [facility], [place] [address], [place], [place] [address]  . Sodium 12/10/2017 137  135 - 145 mmol/L Final  . Potassium 12/10/2017 4.4  3.5 - 5.1 mmol/L Final  . Chloride 12/10/2017 107  98 - 111 mmol/L Final  . CO2 12/10/2017 24  22 - 32 mmol/L Final  . Glucose, Bld 12/10/2017 122* 70 - 99 mg/dL Final  . BUN 12/10/2017 12  8 - 23 mg/dL Final  . Creatinine, Ser 12/10/2017 0.57  0.44 - 1.00 mg/dL Final  . Calcium 12/10/2017 8.4* 8.9 - 10.3 mg/dL Final  . GFR calc non Af Amer  12/10/2017 >60  >60 mL/min Final  . GFR calc Af Amer 12/10/2017 >60  >60 mL/min Final   Comment: (NOTE) The eGFR has been calculated using the CKD EPI equation. This calculation has not been validated in all clinical situations. eGFR's persistently <60 mL/min signify possible Chronic Kidney Disease.   Georgiann Hahn gap 12/10/2017 6  5 - 15 Final   Performed at [facility], [place] [address], [place], [place] [address]  . Order Confirmation 12/10/2017    Final                   Value:ORDER PROCESSED BY BLOOD BANK Performed at [facility], [place] [address], [place], [place] [address]   . Hemoglobin 12/10/2017 10.2* 12.0 - 15.0 g/dL Final   Comment: REPEATED TO VERIFY POST TRANSFUSION SPECIMEN DELTA CHECK NOTED   . HCT 12/10/2017 31.4* 36.0 - 46.0 % Final   Performed at [place] [address], [place], [place] [address]  . WBC 12/11/2017 8.4  4.0 - 10.5 K/uL Final  . RBC 12/11/2017 3.28* 3.87 - 5.11 MIL/uL Final  . Hemoglobin 12/11/2017 10.1* 12.0 - 15.0 g/dL Final  . HCT 12/11/2017 31.5* 36.0 - 46.0 % Final  . MCV 12/11/2017 96.0  80.0 - 100.0 fL Final  . MCH 12/11/2017 30.8  26.0 - 34.0 pg Final  . MCHC 12/11/2017 32.1  30.0 - 36.0 g/dL Final  . RDW 12/11/2017 15.2  11.5 - 15.5 % Final  . Platelets 12/11/2017 185  150 - 400 K/uL Final  . nRBC 12/11/2017 0.0  0.0 - 0.2 % Final   Performed at [facility], [place] [address], [place], [place] [address]  Hospital Outpatient Visit on 12/02/2017  Component Date Value Ref Range Status  . MRSA, PCR 12/02/2017 NEGATIVE  NEGATIVE Final  . Staphylococcus aureus 12/02/2017 NEGATIVE  NEGATIVE Final   Comment: (NOTE) The Xpert SA Assay (FDA approved for NASAL specimens in patients 56 years of age and older), is one component of a comprehensive surveillance program. It is not intended to diagnose infection nor to guide or monitor treatment. Performed at [facility], [place] [address] [place], [place] [address]   . aPTT 12/02/2017 38* 24 - 36 seconds Final   Comment:        IF BASELINE aPTT IS ELEVATED, SUGGEST PATIENT RISK ASSESSMENT BE USED TO DETERMINE APPROPRIATE ANTICOAGULANT THERAPY. Performed at [facility], [place] [address], [place], [place] [address]   . WBC 12/02/2017 9.8  4.0 - 10.5 K/uL  Final  . RBC 12/02/2017 3.84* 3.87 - 5.11 MIL/uL Final  . Hemoglobin 12/02/2017 11.9* 12.0 - 15.0 g/dL Final  . HCT 12/02/2017 37.2  36.0 - 46.0 % Final  . MCV 12/02/2017 96.9  80.0 - 100.0 fL Final  . MCH 12/02/2017 31.0  26.0 - 34.0 pg Final  . MCHC 12/02/2017 32.0  30.0 - 36.0 g/dL Final  . RDW 12/02/2017 13.6  11.5 - 15.5 % Final  . Platelets 12/02/2017 259  150 - 400 K/uL Final  . nRBC 12/02/2017 0.0  0.0 - 0.2 % Final   Performed at [facility], [place] [address] [place], [place] [address]  . Sodium 12/02/2017 135  135 - 145 mmol/L Final  . Potassium 12/02/2017 5.1  3.5 - 5.1 mmol/L Final  . Chloride 12/02/2017 97* 98 - 111 mmol/L Final  . CO2 12/02/2017 27  22 - 32 mmol/L Final  . Glucose, Bld 12/02/2017 112* 70 - 99 mg/dL Final  . BUN 12/02/2017 17  8 - 23 mg/dL Final  . Creatinine, Ser 12/02/2017 0.81  0.44 - 1.00 mg/dL Final  . Calcium 12/02/2017 10.4* 8.9 - 10.3 mg/dL Final  . Total Protein 12/02/2017 7.7  6.5 - 8.1 g/dL Final  . Albumin 12/02/2017 4.5  3.5 - 5.0 g/dL Final  . AST 12/02/2017 22  15 - 41 U/L Final  . ALT 12/02/2017 19  0 - 44 U/L Final  . Alkaline Phosphatase 12/02/2017 40  38 - 126 U/L Final  . Total Bilirubin 12/02/2017 0.7  0.3 - 1.2 mg/dL Final  . GFR calc non Af Amer 12/02/2017 >60  >60 mL/min Final  . GFR calc Af Amer 12/02/2017 >60  >60 mL/min Final   Comment: (NOTE) The eGFR has been calculated using the CKD EPI equation. This calculation has not been validated in all clinical situations. eGFR's persistently <60 mL/min signify possible Chronic  Kidney Disease.   Georgiann Hahn gap 12/02/2017 11  5 - 15 Final   Performed at [facility], [place] [address] [place] [address] [place], [place] [address]  . Prothrombin Time 12/02/2017 12.0  11.4 - 15.2 seconds Final  . INR 12/02/2017 0.90   Final   Performed at [place] [address], [place],  [address]  . ABO/RH(D) 12/02/2017 A POS   Final  . Antibody Screen 12/02/2017 NEG   Final  . Sample Expiration 12/02/2017 12/11/2017   Final  . Extend sample reason 12/02/2017 NO TRANSFUSIONS OR PREGNANCY IN THE PAST 3 MONTHS   Final  . Unit Number 12/02/2017 [id number]   Final  . Blood Component Type 12/02/2017 RED CELLS,LR   Final  . Unit division 12/02/2017 00   Final  . Status of Unit 12/02/2017 ISSUED,FINAL   Final  . Transfusion Status 12/02/2017 OK TO TRANSFUSE   Final  . Crossmatch Result 12/02/2017    Final                   Value:Compatible Performed at [facility], [place] [address] [place],  [address]   . Unit Number 12/02/2017 [id number]   Final  . Blood Component Type 12/02/2017 RED CELLS,LR   Final  . Unit division 12/02/2017 00   Final  . Status of Unit 12/02/2017 ISSUED,FINAL   Final  . Transfusion Status 12/02/2017 OK TO TRANSFUSE   Final  . Crossmatch Result 12/02/2017 Compatible   Final  . ABO/RH(D) 12/02/2017    Final  Value:A POS Performed at [facility], [place] [address] [place], [place] [address]   . ISSUE DATE / TIME 12/02/2017 [id number]   Final  . Blood Product Unit Number 12/02/2017 [id number]   Final  . PRODUCT CODE 12/02/2017 [id number]   Final  . Unit Type and Rh 12/02/2017 6200   Final  . Blood Product Expiration Date 12/02/2017 [id number]   Final  . ISSUE DATE / TIME 12/02/2017 [id number]   Final  . Blood Product Unit Number 12/02/2017 [id number]   Final  . PRODUCT CODE 12/02/2017 [id number]   Final  . Unit Type and Rh 12/02/2017 6200   Final  . Blood  Product Expiration Date 12/02/2017 [id number]   Final     X-Rays:Dg Pelvis Portable  Result Date: 12/08/2017 CLINICAL DATA:  Left total hip arthroplasty EXAM: PORTABLE PELVIS 1-2 VIEWS COMPARISON:  None. FINDINGS: Status post left total hip arthroplasty, with well-positioned left acetabular and left proximal femoral prostheses. No evidence of hip dislocation on this single frontal view. No acute osseous fracture. Expected soft tissue gas surrounding the left hip. Surgical drain overlies the lateral left hip. IMPRESSION: Satisfactory immediate postoperative appearance status post left total hip arthroplasty. Electronically Signed   By: Ilona Sorrel M.D.   On: 12/08/2017 13:11    EKG: Orders placed or performed during the hospital encounter of 12/02/17  . EKG 12 lead  . EKG 12 lead     Hospital Course: Abigail Wolf is a 83 y.o. who was admitted to [facility]. They were brought to the operating room on 12/08/2017 and underwent Procedure(s): Left Acetabular revision.  Patient tolerated the procedure well and was later transferred to the recovery room and then to the orthopaedic floor for postoperative care. They were given PO and IV analgesics for pain control following their surgery. They were given 24 hours of postoperative antibiotics of  Anti-infectives (From admission, onward)   Start     Dose/Rate Route Frequency Ordered Stop   12/08/17 1400  ceFAZolin (ANCEF) IVPB 1 g/50 mL premix     1 g 100 mL/hr over 30 Minutes Intravenous Every 6 hours 12/08/17 1323 12/08/17 2021   12/08/17 0645  ceFAZolin (ANCEF) IVPB 2g/100 mL premix     2 g 200 mL/hr over 30 Minutes Intravenous On call to O.R. 12/08/17 0630 12/08/17 0848     and started on DVT prophylaxis in the form of Aspirin.   PT and OT were ordered for total joint protocol. Discharge planning consulted to help with postop disposition and equipment needs. Patient had a good night on the evening of surgery. They started to get up OOB  with therapy on POD #0. Hemovac drain was pulled without difficulty on day one. Continued to work with therapy into POD #2. Hemoglobin dropped to 6.9 on the morning of day 2, and 2 units PRBCs were ordered. Dressing was changed and the incision was clean, dry and intact with no drainage. Patient was seen on POD #3 and was ready to go home. She worked with therapy for one additional session and was meeting her goals. She was discharged to home later that day in stable condition.   Diet: Regular diet Activity: WBAT  Follow-up: in 2 weeks with Dr. Wynelle Link Disposition: Home with HHPT Discharged Condition: stable   Discharge Instructions    Call MD / Call 911   Complete by:  As directed    If you experience chest pain or shortness of breath, CALL 911 and be  transported to the hospital emergency room.  If you develope a fever above 101 F, pus (white drainage) or increased drainage or redness at the wound, or calf pain, call your surgeon's office.   Call MD / Call 911   Complete by:  As directed    If you experience chest pain or shortness of breath, CALL 911 and be transported to the hospital emergency room.  If you develope a fever above 101 F, pus (white drainage) or increased drainage or redness at the wound, or calf pain, call your surgeon's office.   Change dressing   Complete by:  As directed    Change the dressing daily with sterile 4 x 4 inch gauze dressing and paper tape.   Constipation Prevention   Complete by:  As directed    Drink plenty of fluids.  Prune juice may be helpful.  You may use a stool softener, such as Colace (over the counter) 100 mg twice a day.  Use MiraLax (over the counter) for constipation as needed.   Constipation Prevention   Complete by:  As directed    Drink plenty of fluids.  Prune juice may be helpful.  You may use a stool softener, such as Colace (over the counter) 100 mg twice a day.  Use MiraLax (over the counter) for constipation as needed.   Diet - low  sodium heart healthy   Complete by:  As directed    Diet - low sodium heart healthy   Complete by:  As directed    Do not sit on low chairs, stoools or toilet seats, as it may be difficult to get up from low surfaces   Complete by:  As directed    Follow the hip precautions as taught in Physical Therapy   Complete by:  As directed    Increase activity slowly as tolerated   Complete by:  As directed    Weight bearing as tolerated   Complete by:  As directed      Allergies as of 12/11/2017      Reactions   Codeine Hives, Itching, Nausea And Vomiting, Nausea Only   Meperidine Hcl Itching, Nausea Only   Oxycodone Hives, Nausea And Vomiting   Propoxyphene Itching, Nausea And Vomiting, Nausea Only   Morphine Itching, Nausea And Vomiting   Pentazocine Nausea And Vomiting   ([place])   Tramadol-acetaminophen Nausea And Vomiting      Medication List    STOP taking these medications   aspirin 81 MG chewable tablet Replaced by:  aspirin 325 MG EC tablet   HYDROcodone-acetaminophen 10-325 MG tablet Commonly known as:  NORCO Replaced by:  HYDROcodone-acetaminophen 5-325 MG tablet   meloxicam 15 MG tablet Commonly known as:  MOBIC     TAKE these medications   alum hydroxide-mag trisilicate 42-87 MG Chew chewable tablet Commonly known as:  GAVISCON Chew 1 tablet by mouth 3 (three) times daily as needed for indigestion or heartburn.   aspirin 325 MG EC tablet Take 1 tablet (325 mg total) by mouth 2 (two) times daily. Take one 325 mg tablet two times a day for 18 days. Then resume one 81 mg aspirin once a day. Replaces:  aspirin 81 MG chewable tablet   atenolol 50 MG tablet Commonly known as:  TENORMIN Take 50 mg by mouth at bedtime.   B Complex-B12 Tabs Take 1 tablet by mouth daily.   CALCIUM 600+D 600-800 MG-UNIT Tabs Generic drug:  Calcium Carb-Cholecalciferol Take 1 tablet by mouth 2 (two)  times daily.   cholecalciferol 1000 units tablet Commonly known as:  VITAMIN  D Take 1,000 Units by mouth daily.   clonazePAM 1 MG tablet Commonly known as:  KLONOPIN Take 0.5-2 mg by mouth 2 (two) times daily as needed for anxiety.   HYDROcodone-acetaminophen 5-325 MG tablet Commonly known as:  NORCO/VICODIN Take 1-2 tablets by mouth every 6 (six) hours as needed for moderate pain (pain score 4-6). Replaces:  HYDROcodone-acetaminophen 10-325 MG tablet   methocarbamol 500 MG tablet Commonly known as:  ROBAXIN Take 1 tablet (500 mg total) by mouth every 6 (six) hours as needed for muscle spasms.   MULTIVITAMIN ADULT PO Take 1 tablet by mouth 2 (two) times daily.   RECLAST IV Inject 1 each into the vein See admin instructions. Yearly   simvastatin 80 MG tablet Commonly known as:  ZOCOR Take 40 mg by mouth at bedtime.            Discharge Care Instructions  (From admission, onward)         Start     Ordered   12/10/17 0000  Weight bearing as tolerated     12/10/17 0716   12/10/17 0000  Change dressing    Comments:  Change the dressing daily with sterile 4 x 4 inch gauze dressing and paper tape.   12/10/17 0716         Follow-up Information    Gaynelle Arabian, MD. Schedule an appointment as soon as possible for a visit on 12/23/2017.   Specialty:  Orthopedic Surgery Contact information: [address] [place] [address] [phone]        [facility] Follow up.   Specialty:  [place] [facility]:  physical therapy Contact information: [address] [place] [address] [phone]           Signed: Theresa Duty, PA-C Orthopedic Surgery 12/20/2017, 9:09 AM

## 2019-07-04 IMAGING — DX DG PORTABLE PELVIS
1 series · 1 of 1 positions shown · non-contrast
Comparison: None.

CLINICAL DATA: Left total hip arthroplasty

EXAM:
PORTABLE PELVIS 1-2 VIEWS

[pelvis ap]
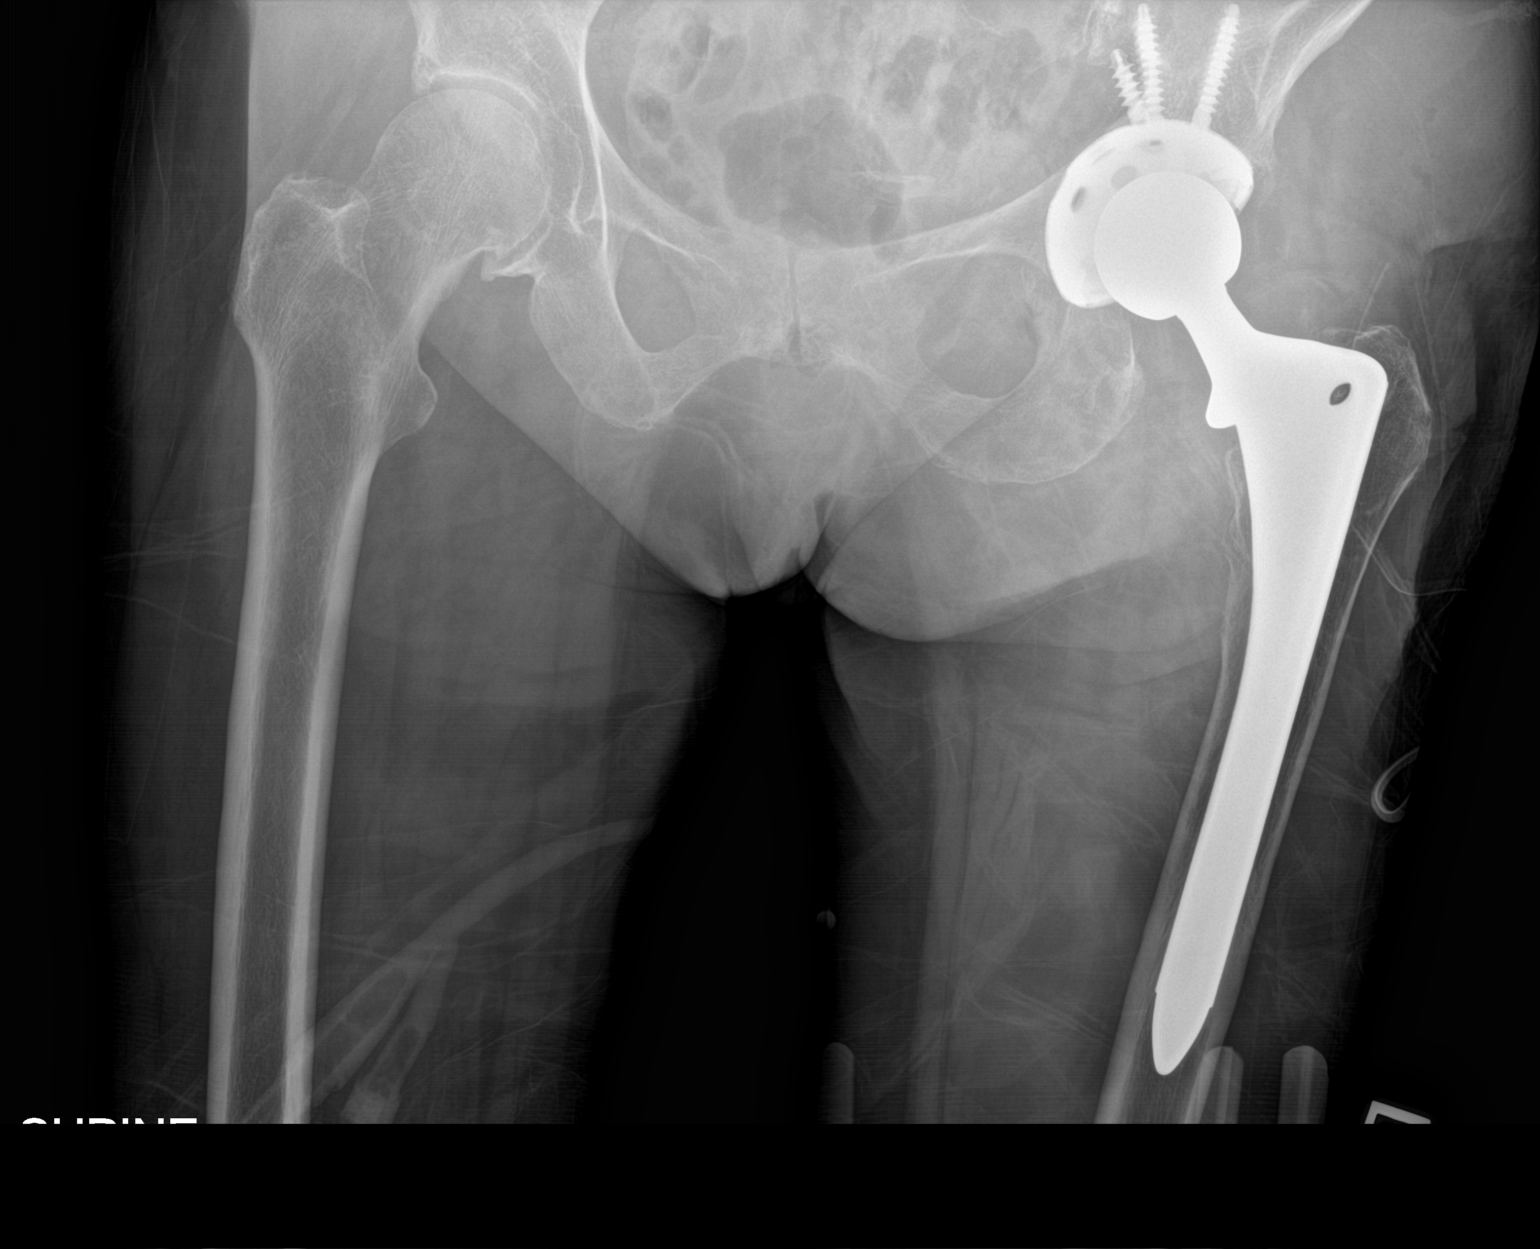

[1 of 1 positions shown; findings below may reference images not displayed]

FINDINGS: Status post left total hip arthroplasty, with well-positioned left
acetabular and left proximal femoral prostheses. No evidence of hip
dislocation on this single frontal view. No acute osseous fracture.
Expected soft tissue gas surrounding the left hip. Surgical drain
overlies the lateral left hip.
IMPRESSION: Satisfactory immediate postoperative appearance status post left
total hip arthroplasty.
